# Patient Record
Sex: Female | Born: 1975 | Race: White | Hispanic: No | Marital: Married | Smoking: Former smoker
Health system: Southern US, Community
[De-identification: ages and names within clinical notes are randomized; demographics above are authoritative.]

## PROBLEM LIST (undated history)

## (undated) DIAGNOSIS — B360 Pityriasis versicolor: Secondary | ICD-10-CM

## (undated) DIAGNOSIS — J020 Streptococcal pharyngitis: Secondary | ICD-10-CM

## (undated) DIAGNOSIS — Z8619 Personal history of other infectious and parasitic diseases: Secondary | ICD-10-CM

## (undated) DIAGNOSIS — Z87898 Personal history of other specified conditions: Secondary | ICD-10-CM

## (undated) DIAGNOSIS — F17200 Nicotine dependence, unspecified, uncomplicated: Secondary | ICD-10-CM

## (undated) DIAGNOSIS — J302 Other seasonal allergic rhinitis: Secondary | ICD-10-CM

## (undated) HISTORY — PX: DILATION AND CURETTAGE OF UTERUS: SHX78

## (undated) HISTORY — DX: Personal history of other specified conditions: Z87.898

## (undated) HISTORY — DX: Personal history of other infectious and parasitic diseases: Z86.19

## (undated) HISTORY — DX: Pityriasis versicolor: B36.0

## (undated) HISTORY — PX: WISDOM TOOTH EXTRACTION: SHX21

---

## 2004-01-06 ENCOUNTER — Other Ambulatory Visit: Admission: RE | Admit: 2004-01-06 | Discharge: 2004-01-06 | Payer: Self-pay | Admitting: Obstetrics and Gynecology

## 2004-02-20 ENCOUNTER — Observation Stay: Payer: Self-pay

## 2004-10-01 ENCOUNTER — Inpatient Hospital Stay: Payer: Self-pay | Admitting: Unknown Physician Specialty

## 2005-12-28 ENCOUNTER — Ambulatory Visit: Payer: Self-pay | Admitting: Gynecology

## 2006-02-11 ENCOUNTER — Ambulatory Visit: Payer: Self-pay | Admitting: Gynecology

## 2006-10-28 ENCOUNTER — Ambulatory Visit: Payer: Self-pay | Admitting: Family Medicine

## 2006-12-27 ENCOUNTER — Ambulatory Visit: Payer: Self-pay | Admitting: Gynecology

## 2007-01-17 ENCOUNTER — Ambulatory Visit: Payer: Self-pay | Admitting: Family Medicine

## 2007-02-03 ENCOUNTER — Ambulatory Visit: Payer: Self-pay | Admitting: Obstetrics & Gynecology

## 2010-09-22 NOTE — Assessment & Plan Note (Signed)
NAMEBENNY, Yvette Adams              ACCOUNT NO.:  [id number]   MEDICAL RECORD NO.:  [id number]          PATIENT TYPE:  POB   LOCATION:  [facility]         FACILITY:  [facility]   PHYSICIAN:  Remonia Richter, NP   DATE OF BIRTH:  [birth year]   DATE OF SERVICE:                                  CLINIC NOTE   Patient comes to the office today for her yearly Pap and pelvic.  Smoking.  She has been complaining of an ongoing yeast infection for the  past 2 years.  She has broken up with her recent boyfriend.  Since then,  she has had a negative gonorrhea, chlamydia but has not been checked for  the other sexually transmitted diseases.  She would also like to quite  smoking.  She has been smoking for approximately 14 years, one package  per day.  She has tried multiple other methods for quitting and has not  been successful.  She is not complaining of any cough but she is  complaining of a sore throat and some allergies.  She is currently happy  with her form of birth control, which is the IUD.  She does need her  forms filled out for work that include her ability to lift and bend.   PHYSICAL EXAMINATION:  VITAL SIGNS:  106/61 blood pressure, pulse is 79,  weight is 121.  GENERAL:  Well-developed, well-nourished 35 year old Caucasian female in  no acute distress.  HEENT:  Normocephalic, atraumatic.  NECK:  Supple.  No thyroid enlargement or masses.  HEART:  Regular rate and rhythm.  LUNGS:  Clear bilaterally.  ABDOMEN:  Soft, nontender with positive bowel sounds.  GENITALIA:  Externally, no lesions or discharges.  Internally, there is  a thick yellow discharge.  Cervix is somewhat friable.  Bimanual exam,  no cervical motion tenderness.  No adnexal mass.  EXTREMITIES:  No edema.  NEURO:  Alert, oriented, non anxious.   ASSESSMENT/PLAN:  1. Yearly Pap smear and pelvic.  She will be called with the results      of her Pap smear.  2. Vaginal discharge.  We will check herpes simplex  virus 1 and 2,      human immunodeficiency virus, rapid plasma reagin at this time.  3. Smoking cessation.  Patient is requesting Chantix.  She will be      given a starter pack for one month and then on to the regular      maintenance dose.  She is strongly encouraged in this endeavor.  4. Employment.  Her forms were filled out and signed.  She will      followup in one year for her annual exam, unless she has difficulty      between now and then.      Remonia Richter, NP     LR/MEDQ  D:  01/17/2007  T:  01/18/2007  Job:  [id number]

## 2011-06-16 ENCOUNTER — Emergency Department (HOSPITAL_COMMUNITY)
Admission: EM | Admit: 2011-06-16 | Discharge: 2011-06-16 | Disposition: A | Discharge status: Home / Self Care | Payer: Self-pay | Source: Home / Self Care | Attending: Emergency Medicine | Admitting: Emergency Medicine

## 2011-06-16 ENCOUNTER — Encounter (HOSPITAL_COMMUNITY): Payer: Self-pay | Admitting: Emergency Medicine

## 2011-06-16 DIAGNOSIS — J029 Acute pharyngitis, unspecified: Secondary | ICD-10-CM

## 2011-06-16 HISTORY — DX: Streptococcal pharyngitis: J02.0

## 2011-06-16 LAB — POCT RAPID STREP A: Streptococcus, Group A Screen (Direct): NEGATIVE

## 2011-06-16 MED ORDER — AMOXICILLIN 500 MG PO CAPS: 500.0000 mg | ORAL_CAPSULE | Freq: Three times a day (TID) | ORAL | Status: AC

## 2011-06-16 MED ORDER — FLUCONAZOLE 200 MG PO TABS: 200.0000 mg | ORAL_TABLET | Freq: Once | ORAL | Status: AC

## 2011-06-16 NOTE — ED Notes (Signed)
HERE WITH SORE THROAT,REDNESS AND WHITE PATCHES THAT STARTED YESTERDAY.PT HAS  HX POS STREP SOMETIMES X4 YEARLY.NO FEVER,N,V OR DIFF SWALLOWING.PT AHS TRIED GARGLING

## 2011-06-16 NOTE — ED Provider Notes (Signed)
History     CSN: [id number]  Arrival date & time 06/16/11  1909   First MD Initiated Contact with Patient 06/16/11 2132      Chief Complaint  Patient presents with  . Sore Throat    (Consider location/radiation/quality/duration/timing/severity/associated sxs/prior treatment) HPI Comments: Patient presents urgent care complaining of sore throat and redness irritation and white patches on her throat this started yesterday he has had many streptococcal infections in the past continues to smoke it hurts mainly when she swallows have perceived some tactile fevers denies any cough or nasal congestion. No shortness of breath.  Patient is a 36 y.o. female presenting with pharyngitis. The history is provided by the patient.  Sore Throat The current episode started 2 days ago. Pertinent negatives include no abdominal pain, no headaches and no shortness of breath. The symptoms are relieved by nothing.    Past Medical History  Diagnosis Date  . Strep throat     History reviewed. No pertinent past surgical history.  No family history on file.  History  Substance Use Topics  . Smoking status: Current Everyday Smoker  . Smokeless tobacco: Not on file  . Alcohol Use: Yes    OB History    Grav Para Term Preterm Abortions TAB SAB Ect Mult Living                  Review of Systems  Constitutional: Negative for fever, diaphoresis and activity change.  HENT: Positive for congestion, sore throat, rhinorrhea and trouble swallowing. Negative for ear pain.   Eyes: Negative for visual disturbance.  Respiratory: Negative for shortness of breath.   Gastrointestinal: Negative for abdominal pain.  Neurological: Negative for headaches.    Allergies  Erythromycin  Home Medications   Current Outpatient Rx  Name Route Sig Dispense Refill  . AMOXICILLIN 500 MG PO CAPS Oral Take 1 capsule (500 mg total) by mouth 3 (three) times daily. 21 capsule 0  . FLUCONAZOLE 200 MG PO TABS Oral Take 1  tablet (200 mg total) by mouth once. 1 tablet 0    BP 111/71  Pulse 88  Temp(Src) 98.4 F (36.9 C) (Oral)  Resp 16  SpO2 100%  Physical Exam  Nursing note and vitals reviewed. Constitutional: She appears well-developed and well-nourished. No distress.  HENT:  Head: Normocephalic.  Mouth/Throat: Uvula is midline. Oropharyngeal exudate, posterior oropharyngeal edema and posterior oropharyngeal erythema present. No tonsillar abscesses.  Eyes: Right eye exhibits no discharge. Left eye exhibits no discharge.  Neck: Normal range of motion. Neck supple. No JVD present.  Lymphadenopathy:    She has cervical adenopathy.    ED Course  Procedures (including critical care time)   Labs Reviewed  POCT RAPID STREP A ([facility] ONLY)   No results found.   1. Pharyngitis       MDM  Exudative pharyngitis.  Jimmie Molly, MD 06/16/11 [phone]

## 2011-07-02 ENCOUNTER — Telehealth (HOSPITAL_COMMUNITY): Payer: Self-pay | Admitting: *Deleted

## 2011-07-02 NOTE — ED Notes (Signed)
2/21 Pt. called and said she took the Diflucan but when she finished antibiotic she still had sx.'s.  Requested a refill. 2/22 I could not find pt.'s chart. I called @ 1215 and left message to call. 1300 Pt. called back and chart accessed. Discussed with Dr. Tressia Danas and order obtained for Fluconazole 150 mg. # 3 1 Q 72 hrs as needed for symptoms of yeast infection. Pt. wants Rx. Called to [facility] in [place]. Rx. Called to pharmacist @ [phone]. Vassie Moselle 07/02/2011

## 2012-03-07 ENCOUNTER — Encounter: Payer: Self-pay | Admitting: Family Medicine

## 2012-03-07 ENCOUNTER — Ambulatory Visit (INDEPENDENT_AMBULATORY_CARE_PROVIDER_SITE_OTHER): Payer: Managed Care, Other (non HMO) | Admitting: Family Medicine

## 2012-03-07 VITALS — BP 113/76 | HR 83 | Ht 63.0 in | Wt 129.0 lb

## 2012-03-07 DIAGNOSIS — T8339XA Other mechanical complication of intrauterine contraceptive device, initial encounter: Secondary | ICD-10-CM

## 2012-03-07 DIAGNOSIS — Z3049 Encounter for surveillance of other contraceptives: Secondary | ICD-10-CM

## 2012-03-07 DIAGNOSIS — F17200 Nicotine dependence, unspecified, uncomplicated: Secondary | ICD-10-CM

## 2012-03-07 DIAGNOSIS — Z30432 Encounter for removal of intrauterine contraceptive device: Secondary | ICD-10-CM

## 2012-03-07 DIAGNOSIS — Z720 Tobacco use: Secondary | ICD-10-CM | POA: Insufficient documentation

## 2012-03-07 DIAGNOSIS — Z309 Encounter for contraceptive management, unspecified: Secondary | ICD-10-CM

## 2012-03-07 DIAGNOSIS — Z30431 Encounter for routine checking of intrauterine contraceptive device: Secondary | ICD-10-CM

## 2012-03-07 MED ORDER — VARENICLINE TARTRATE 0.5 MG PO TABS: 1.0000 mg | ORAL_TABLET | Freq: Two times a day (BID) | ORAL | Status: DC

## 2012-03-07 MED ORDER — VARENICLINE TARTRATE 1 MG PO TABS: 1.0000 mg | ORAL_TABLET | Freq: Two times a day (BID) | ORAL | Status: DC

## 2012-03-07 NOTE — Assessment & Plan Note (Signed)
Attempted removal in office, but failed--will attempt in OR when pt. More comfortable--will schedule.  Pt. Is unsure about further contraception and will consider options.  Considering BTL--will call back if desires.

## 2012-03-07 NOTE — Progress Notes (Signed)
Subjective:    Patient ID: Yvette Adams, female    DOB: 11-07-1975, 36 y.o.   MRN: [id number]  HPI Here for IUD removal.  Placed 8 yrs ago and still amenorrheic.  She desires to switch to OC's.  She is 36 and a smoker.  Options reviewed with pt.  She is unsure about options.  Does not want more kids.  Discussed permanent options.  Wants lighter cycles.  She is interested in smoking cessation.  Enquires about chantix.  Has tried and failed Wellbutrin in the past.  Past Medical History  Diagnosis Date  . Strep throat   . History of abnormal Pap smear   . History of herpes simplex type 2 infection    Past Surgical History  Procedure Date  . Wisdom tooth extraction     x4   History   Social History  . Marital Status: Single    Spouse Name: N/A    Number of Children: N/A  . Years of Education: N/A   Occupational History  . Not on file.   Social History Main Topics  . Smoking status: Current Every Day Smoker -- 1.0 packs/day  . Smokeless tobacco: Not on file   Comment: Patient would like prescription to help.  . Alcohol Use: Yes  . Drug Use: No  . Sexually Active: Yes -- Female partner(s)   Other Topics Concern  . Not on file   Social History Narrative  . No narrative on file   Family History  Problem Relation Age of Onset  . Asthma Mother   . Hypertension Mother   . Alcohol abuse Father   . Cancer Maternal Grandmother     pancreatic  . Diabetes Maternal Grandmother    Allergies  Allergen Reactions  . Erythromycin     ABDOMINAL CRAMPING   Current Outpatient Prescriptions on File Prior to Visit  Medication Sig Dispense Refill  . fluticasone (FLONASE) 50 MCG/ACT nasal spray        Review of Systems  Constitutional: Negative for fever and chills.  HENT: Positive for congestion, sore throat and rhinorrhea. Negative for nosebleeds.   Eyes: Negative for visual disturbance.  Respiratory: Negative for chest tightness and shortness of breath.   Cardiovascular:  Negative for chest pain and leg swelling.  Gastrointestinal: Negative for nausea, vomiting, abdominal pain, diarrhea, constipation, blood in stool and abdominal distention.  Genitourinary: Positive for vaginal discharge (white, with IUD). Negative for dysuria, frequency, vaginal bleeding and menstrual problem.  Musculoskeletal: Negative for arthralgias.  Skin: Negative for rash.  Neurological: Negative for dizziness and headaches.  Psychiatric/Behavioral: Negative for behavioral problems, disturbed wake/sleep cycle and dysphoric mood.  All other systems reviewed and are negative.      Objective:   Physical Exam  Vitals reviewed. Constitutional: She is oriented to person, place, and time. She appears well-developed and well-nourished.  HENT:  Head: Normocephalic and atraumatic.  Eyes: No scleral icterus.  Neck: Neck supple.  Cardiovascular: Normal rate and regular rhythm.   Pulmonary/Chest: Effort normal.  Abdominal: Soft. There is no tenderness.  Genitourinary: Vagina normal and uterus normal. No vaginal discharge found.  Musculoskeletal: Normal range of motion.  Neurological: She is alert and oriented to person, place, and time.  Skin: Skin is warm and dry.  Psychiatric: She has a normal mood and affect.   Procedure:  Speculum inserted into the vagina.  IUD strings could not be visualized.  Curved long Kelly clamp passed into cervical os x 4 without grasping part of  IUD or strings.  IUD hook inserted into uterine cavity x 3 and IUD could not be gotten, although it was felt.  Pt. Could no longer tolerate the discomfort of the procedure.    Assessment & Plan:

## 2012-03-07 NOTE — Patient Instructions (Signed)
Smoking Cessation Quitting smoking is important to your health and has many advantages. However, it is not always easy to quit since nicotine is a very addictive drug. Often times, people try 3 times or more before being able to quit. This document explains the best ways for you to prepare to quit smoking. Quitting takes hard work and a lot of effort, but you can do it. ADVANTAGES OF QUITTING SMOKING  You will live longer, feel better, and live better.  Your body will feel the impact of quitting smoking almost immediately.  Within 20 minutes, blood pressure decreases. Your pulse returns to its normal level.  After 8 hours, carbon monoxide levels in the blood return to normal. Your oxygen level increases.  After 24 hours, the chance of having a heart attack starts to decrease. Your breath, hair, and body stop smelling like smoke.  After 48 hours, damaged nerve endings begin to recover. Your sense of taste and smell improve.  After 72 hours, the body is virtually free of nicotine. Your bronchial tubes relax and breathing becomes easier.  After 2 to 12 weeks, lungs can hold more air. Exercise becomes easier and circulation improves.  The risk of having a heart attack, stroke, cancer, or lung disease is greatly reduced.  After 1 year, the risk of coronary heart disease is cut in half.  After 5 years, the risk of stroke falls to the same as a nonsmoker.  After 10 years, the risk of lung cancer is cut in half and the risk of other cancers decreases significantly.  After 15 years, the risk of coronary heart disease drops, usually to the level of a nonsmoker.  If you are pregnant, quitting smoking will improve your chances of having a healthy baby.  The people you live with, especially any children, will be healthier.  You will have extra money to spend on things other than cigarettes. QUESTIONS TO THINK ABOUT BEFORE ATTEMPTING TO QUIT You may want to talk about your answers with your  caregiver.  Why do you want to quit?  If you tried to quit in the past, what helped and what did not?  What will be the most difficult situations for you after you quit? How will you plan to handle them?  Who can help you through the tough times? Your family? Friends? A caregiver?  What pleasures do you get from smoking? What ways can you still get pleasure if you quit? Here are some questions to ask your caregiver:  How can you help me to be successful at quitting?  What medicine do you think would be best for me and how should I take it?  What should I do if I need more help?  What is smoking withdrawal like? How can I get information on withdrawal? GET READY  Set a quit date.  Change your environment by getting rid of all cigarettes, ashtrays, matches, and lighters in your home, car, or work. Do not let people smoke in your home.  Review your past attempts to quit. Think about what worked and what did not. GET SUPPORT AND ENCOURAGEMENT You have a better chance of being successful if you have help. You can get support in many ways.  Tell your family, friends, and co-workers that you are going to quit and need their support. Ask them not to smoke around you.  Get individual, group, or telephone counseling and support. Programs are available at local hospitals and health centers. Call your local health department for   information about programs in your area.  Spiritual beliefs and practices may help some smokers quit.  Download a "quit meter" on your computer to keep track of quit statistics, such as how long you have gone without smoking, cigarettes not smoked, and money saved.  Get a self-help book about quitting smoking and staying off of tobacco. LEARN NEW SKILLS AND BEHAVIORS  Distract yourself from urges to smoke. Talk to someone, go for a walk, or occupy your time with a task.  Change your normal routine. Take a different route to work. Drink tea instead of coffee.  Eat breakfast in a different place.  Reduce your stress. Take a hot bath, exercise, or read a book.  Plan something enjoyable to do every day. Reward yourself for not smoking.  Explore interactive web-based programs that specialize in helping you quit. GET MEDICINE AND USE IT CORRECTLY Medicines can help you stop smoking and decrease the urge to smoke. Combining medicine with the above behavioral methods and support can greatly increase your chances of successfully quitting smoking.  Nicotine replacement therapy helps deliver nicotine to your body without the negative effects and risks of smoking. Nicotine replacement therapy includes nicotine gum, lozenges, inhalers, nasal sprays, and skin patches. Some may be available over-the-counter and others require a prescription.  Antidepressant medicine helps people abstain from smoking, but how this works is unknown. This medicine is available by prescription.  Nicotinic receptor partial agonist medicine simulates the effect of nicotine in your brain. This medicine is available by prescription. Ask your caregiver for advice about which medicines to use and how to use them based on your health history. Your caregiver will tell you what side effects to look out for if you choose to be on a medicine or therapy. Carefully read the information on the package. Do not use any other product containing nicotine while using a nicotine replacement product.  RELAPSE OR DIFFICULT SITUATIONS Most relapses occur within the first 3 months after quitting. Do not be discouraged if you start smoking again. Remember, most people try several times before finally quitting. You may have symptoms of withdrawal because your body is used to nicotine. You may crave cigarettes, be irritable, feel very hungry, cough often, get headaches, or have difficulty concentrating. The withdrawal symptoms are only temporary. They are strongest when you first quit, but they will go away within  10 14 days. To reduce the chances of relapse, try to:  Avoid drinking alcohol. Drinking lowers your chances of successfully quitting.  Reduce the amount of caffeine you consume. Once you quit smoking, the amount of caffeine in your body increases and can give you symptoms, such as a rapid heartbeat, sweating, and anxiety.  Avoid smokers because they can make you want to smoke.  Do not let weight gain distract you. Many smokers will gain weight when they quit, usually less than 10 pounds. Eat a healthy diet and stay active. You can always lose the weight gained after you quit.  Find ways to improve your mood other than smoking. FOR MORE INFORMATION  www.smokefree.gov  Document Released: 04/20/2001 Document Revised: 10/26/2011 Document Reviewed: 08/05/2011 [facility] Patient Information 2013 [place], [place]. Contraception Choices Contraception (birth control) is the use of any methods or devices to prevent pregnancy. Below are some methods to help avoid pregnancy. HORMONAL METHODS   Contraceptive implant. This is a thin, plastic tube containing progesterone hormone. It does not contain estrogen hormone. Your caregiver inserts the tube in the inner part of the upper arm.  The tube can remain in place for up to 3 years. After 3 years, the implant must be removed. The implant prevents the ovaries from releasing an egg (ovulation), thickens the cervical mucus which prevents sperm from entering the uterus, and thins the lining of the inside of the uterus.  Progesterone-only injections. These injections are given every 3 months by your caregiver to prevent pregnancy. This synthetic progesterone hormone stops the ovaries from releasing eggs. It also thickens cervical mucus and changes the uterine lining. This makes it harder for sperm to survive in the uterus.  Birth control pills. These pills contain estrogen and progesterone hormone. They work by stopping the egg from forming in the ovary (ovulation).  Birth control pills are prescribed by a caregiver.Birth control pills can also be used to treat heavy periods.  Minipill. This type of birth control pill contains only the progesterone hormone. They are taken every day of each month and must be prescribed by your caregiver.  Birth control patch. The patch contains hormones similar to those in birth control pills. It must be changed once a week and is prescribed by a caregiver.  Vaginal ring. The ring contains hormones similar to those in birth control pills. It is left in the vagina for 3 weeks, removed for 1 week, and then a new one is put back in place. The patient must be comfortable inserting and removing the ring from the vagina.A caregiver's prescription is necessary.  Emergency contraception. Emergency contraceptives prevent pregnancy after unprotected sexual intercourse. This pill can be taken right after sex or up to 5 days after unprotected sex. It is most effective the sooner you take the pills after having sexual intercourse. Emergency contraceptive pills are available without a prescription. Check with your pharmacist. Do not use emergency contraception as your only form of birth control. BARRIER METHODS   Female condom. This is a thin sheath (latex or rubber) that is worn over the penis during sexual intercourse. It can be used with spermicide to increase effectiveness.  Female condom. This is a soft, loose-fitting sheath that is put into the vagina before sexual intercourse.  Diaphragm. This is a soft, latex, dome-shaped barrier that must be fitted by a caregiver. It is inserted into the vagina, along with a spermicidal jelly. It is inserted before intercourse. The diaphragm should be left in the vagina for 6 to 8 hours after intercourse.  Cervical cap. This is a round, soft, latex or plastic cup that fits over the cervix and must be fitted by a caregiver. The cap can be left in place for up to 48 hours after intercourse.  Sponge.  This is a soft, circular piece of polyurethane foam. The sponge has spermicide in it. It is inserted into the vagina after wetting it and before sexual intercourse.  Spermicides. These are chemicals that kill or block sperm from entering the cervix and uterus. They come in the form of creams, jellies, suppositories, foam, or tablets. They do not require a prescription. They are inserted into the vagina with an applicator before having sexual intercourse. The process must be repeated every time you have sexual intercourse. INTRAUTERINE CONTRACEPTION  Intrauterine device (IUD). This is a T-shaped device that is put in a woman's uterus during a menstrual period to prevent pregnancy. There are 2 types:  Copper IUD. This type of IUD is wrapped in copper wire and is placed inside the uterus. Copper makes the uterus and fallopian tubes produce a fluid that kills sperm. It can  stay in place for 10 years.  Hormone IUD. This type of IUD contains the hormone progestin (synthetic progesterone). The hormone thickens the cervical mucus and prevents sperm from entering the uterus, and it also thins the uterine lining to prevent implantation of a fertilized egg. The hormone can weaken or kill the sperm that get into the uterus. It can stay in place for 5 years. PERMANENT METHODS OF CONTRACEPTION  Female tubal ligation. This is when the woman's fallopian tubes are surgically sealed, tied, or blocked to prevent the egg from traveling to the uterus.  Female sterilization. This is when the female has the tubes that carry sperm tied off (vasectomy).This blocks sperm from entering the vagina during sexual intercourse. After the procedure, the man can still ejaculate fluid (semen). NATURAL PLANNING METHODS  Natural family planning. This is not having sexual intercourse or using a barrier method (condom, diaphragm, cervical cap) on days the woman could become pregnant.  Calendar method. This is keeping track of the length  of each menstrual cycle and identifying when you are fertile.  Ovulation method. This is avoiding sexual intercourse during ovulation.  Symptothermal method. This is avoiding sexual intercourse during ovulation, using a thermometer and ovulation symptoms.  Post-ovulation method. This is timing sexual intercourse after you have ovulated. Regardless of which type or method of contraception you choose, it is important that you use condoms to protect against the transmission of sexually transmitted diseases (STDs). Talk with your caregiver about which form of contraception is most appropriate for you. Document Released: 04/26/2005 Document Revised: 07/19/2011 Document Reviewed: 09/02/2010 [facility] Patient Information 2013 [place], [place].

## 2012-03-07 NOTE — Assessment & Plan Note (Signed)
Trial of chantix--take 1/2 tab bid for 1 wk, then increase to 1 tab bid.  Side effects discussed at length.

## 2012-03-08 ENCOUNTER — Encounter (HOSPITAL_COMMUNITY): Payer: Self-pay | Admitting: Pharmacist

## 2012-03-28 NOTE — OR Nursing (Signed)
Case moved to 0900 per Dr. Request via [place].

## 2012-04-07 ENCOUNTER — Encounter (HOSPITAL_COMMUNITY): Payer: Self-pay | Admitting: *Deleted

## 2012-04-13 ENCOUNTER — Encounter (HOSPITAL_COMMUNITY): Payer: Self-pay | Admitting: Certified Registered"

## 2012-04-13 ENCOUNTER — Ambulatory Visit (HOSPITAL_COMMUNITY)
Admission: RE | Admit: 2012-04-13 | Discharge: 2012-04-13 | Disposition: A | Discharge status: Home / Self Care | Payer: Managed Care, Other (non HMO) | Source: Ambulatory Visit | Attending: Family Medicine | Admitting: Family Medicine

## 2012-04-13 ENCOUNTER — Encounter (HOSPITAL_COMMUNITY)
Admission: RE | Disposition: A | Discharge status: Home / Self Care | Payer: Self-pay | Source: Ambulatory Visit | Attending: Family Medicine

## 2012-04-13 ENCOUNTER — Ambulatory Visit (HOSPITAL_COMMUNITY): Payer: Managed Care, Other (non HMO) | Admitting: Certified Registered"

## 2012-04-13 DIAGNOSIS — Z30430 Encounter for insertion of intrauterine contraceptive device: Secondary | ICD-10-CM

## 2012-04-13 DIAGNOSIS — Z30433 Encounter for removal and reinsertion of intrauterine contraceptive device: Secondary | ICD-10-CM | POA: Insufficient documentation

## 2012-04-13 DIAGNOSIS — T8339XA Other mechanical complication of intrauterine contraceptive device, initial encounter: Secondary | ICD-10-CM | POA: Diagnosis present

## 2012-04-13 HISTORY — PX: INTRAUTERINE DEVICE (IUD) INSERTION: SHX5877

## 2012-04-13 HISTORY — DX: Other seasonal allergic rhinitis: J30.2

## 2012-04-13 HISTORY — DX: Nicotine dependence, unspecified, uncomplicated: F17.200

## 2012-04-13 HISTORY — PX: IUD REMOVAL: SHX5392

## 2012-04-13 LAB — CBC
MCH: 30 pg (ref 26.0–34.0)
Platelets: 202 10*3/uL (ref 150–400)
RBC: 4.36 MIL/uL (ref 3.87–5.11)
WBC: 5.9 10*3/uL (ref 4.0–10.5)

## 2012-04-13 LAB — PREGNANCY, URINE: Preg Test, Ur: NEGATIVE

## 2012-04-13 SURGERY — REMOVAL, INTRAUTERINE DEVICE
Anesthesia: Monitor Anesthesia Care | Site: Vagina | Wound class: Clean Contaminated

## 2012-04-13 MED ORDER — LIDOCAINE HCL (CARDIAC) 20 MG/ML IV SOLN
INTRAVENOUS | Status: DC | PRN
  Administered 2012-04-13: 50 mg via INTRAVENOUS

## 2012-04-13 MED ORDER — ONDANSETRON HCL 4 MG/2ML IJ SOLN
INTRAMUSCULAR | Status: DC | PRN
  Administered 2012-04-13: 4 mg via INTRAVENOUS

## 2012-04-13 MED ORDER — FENTANYL CITRATE 0.05 MG/ML IJ SOLN
INTRAMUSCULAR | Status: AC
  Filled 2012-04-13: qty 2

## 2012-04-13 MED ORDER — CEFAZOLIN SODIUM-DEXTROSE 2-3 GM-% IV SOLR
2.0000 g | INTRAVENOUS | Status: AC
  Administered 2012-04-13: 2 g via INTRAVENOUS

## 2012-04-13 MED ORDER — PROPOFOL 10 MG/ML IV EMUL
INTRAVENOUS | Status: DC | PRN
  Administered 2012-04-13 (×4): 20 mg via INTRAVENOUS
  Administered 2012-04-13: 40 mg via INTRAVENOUS
  Administered 2012-04-13: 20 mg via INTRAVENOUS
  Administered 2012-04-13: 10 mg via INTRAVENOUS
  Administered 2012-04-13: 40 mg via INTRAVENOUS
  Administered 2012-04-13: 20 mg via INTRAVENOUS

## 2012-04-13 MED ORDER — FENTANYL CITRATE 0.05 MG/ML IJ SOLN: 25.0000 ug | INTRAMUSCULAR | Status: DC | PRN

## 2012-04-13 MED ORDER — ONDANSETRON HCL 4 MG/2ML IJ SOLN
INTRAMUSCULAR | Status: AC
  Filled 2012-04-13: qty 2

## 2012-04-13 MED ORDER — MIDAZOLAM HCL 5 MG/5ML IJ SOLN
INTRAMUSCULAR | Status: DC | PRN
  Administered 2012-04-13: 2 mg via INTRAVENOUS

## 2012-04-13 MED ORDER — PROPOFOL 10 MG/ML IV EMUL
INTRAVENOUS | Status: AC
  Filled 2012-04-13: qty 20

## 2012-04-13 MED ORDER — LIDOCAINE HCL 1 % IJ SOLN
INTRAMUSCULAR | Status: DC | PRN
  Administered 2012-04-13: 20 mL

## 2012-04-13 MED ORDER — DEXAMETHASONE SODIUM PHOSPHATE 10 MG/ML IJ SOLN
INTRAMUSCULAR | Status: AC
  Filled 2012-04-13: qty 1

## 2012-04-13 MED ORDER — KETOROLAC TROMETHAMINE 30 MG/ML IJ SOLN: 15.0000 mg | Freq: Once | INTRAMUSCULAR | Status: DC | PRN

## 2012-04-13 MED ORDER — IBUPROFEN 600 MG PO TABS: 600.0000 mg | ORAL_TABLET | Freq: Four times a day (QID) | ORAL | Status: DC | PRN

## 2012-04-13 MED ORDER — GLYCOPYRROLATE 0.2 MG/ML IJ SOLN
INTRAMUSCULAR | Status: AC
  Filled 2012-04-13: qty 1

## 2012-04-13 MED ORDER — MIDAZOLAM HCL 2 MG/2ML IJ SOLN
INTRAMUSCULAR | Status: AC
  Filled 2012-04-13: qty 2

## 2012-04-13 MED ORDER — LIDOCAINE HCL (CARDIAC) 20 MG/ML IV SOLN
INTRAVENOUS | Status: AC
  Filled 2012-04-13: qty 5

## 2012-04-13 MED ORDER — LACTATED RINGERS IV SOLN
INTRAVENOUS | Status: DC
  Administered 2012-04-13: 09:00:00 via INTRAVENOUS

## 2012-04-13 MED ORDER — CEFAZOLIN SODIUM-DEXTROSE 2-3 GM-% IV SOLR
INTRAVENOUS | Status: AC
  Filled 2012-04-13: qty 50

## 2012-04-13 MED ORDER — FENTANYL CITRATE 0.05 MG/ML IJ SOLN
INTRAMUSCULAR | Status: DC | PRN
  Administered 2012-04-13: 50 ug via INTRAVENOUS
  Administered 2012-04-13 (×2): 25 ug via INTRAVENOUS

## 2012-04-13 MED ORDER — LACTATED RINGERS IV SOLN
Freq: Once | INTRAVENOUS | Status: AC
  Administered 2012-04-13: 09:00:00 via INTRAVENOUS

## 2012-04-13 SURGICAL SUPPLY — 14 items
CATH ROBINSON RED A/P 16FR (CATHETERS) ×2 IMPLANT
CLOTH BEACON ORANGE TIMEOUT ST (SAFETY) ×2 IMPLANT
COVER TABLE BACK 60X90 (DRAPES) ×1 IMPLANT
DRAPE UNDERBUTTOCKS STRL (DRAPE) ×1 IMPLANT
GLOVE BIOGEL PI IND STRL 7.0 (GLOVE) ×2 IMPLANT
GLOVE BIOGEL PI INDICATOR 7.0 (GLOVE) ×2
GLOVE ECLIPSE 7.0 STRL STRAW (GLOVE) ×2 IMPLANT
GOWN STRL REIN XL XLG (GOWN DISPOSABLE) ×4 IMPLANT
NDL SPNL 22GX3.5 QUINCKE BK (NEEDLE) ×1 IMPLANT
NEEDLE SPNL 22GX3.5 QUINCKE BK (NEEDLE) ×2 IMPLANT
PAD PREP 24X48 CUFFED NSTRL (MISCELLANEOUS) ×2 IMPLANT
SYR CONTROL 10ML LL (SYRINGE) ×2 IMPLANT
TOWEL OR 17X24 6PK STRL BLUE (TOWEL DISPOSABLE) ×4 IMPLANT
WATER STERILE IRR 1000ML POUR (IV SOLUTION) ×2 IMPLANT

## 2012-04-13 NOTE — H&P (Signed)
Yvette Adams is an 36 y.o. G33P1011 Unknown female.   Chief Complaint: Retained IUD  HPI: 36 y.o. G2P1011 who has a retained IUD and it has been in for 8 years.  She would like it removed and would like another IUD inserted.   Past Medical History  Diagnosis Date  . Strep throat   . History of abnormal Pap smear   . History of herpes simplex type 2 infection   . Seasonal allergies   . Smoker   . SVD (spontaneous vaginal delivery)     x 1    Past Surgical History  Procedure Date  . Wisdom tooth extraction     x4  . Dilation and curettage of uterus     Family History  Problem Relation Age of Onset  . Asthma Mother   . Hypertension Mother   . Alcohol abuse Father   . Cancer Maternal Grandmother     pancreatic  . Diabetes Maternal Grandmother    Social History:  reports that she has been smoking Cigarettes.  She has a 20 pack-year smoking history. She has never used smokeless tobacco. She reports that she drinks alcohol. She reports that she does not use illicit drugs.  Allergies:  Allergies  Allergen Reactions  . Erythromycin     ABDOMINAL CRAMPING OK with ZPAK    Medications Prior to Admission  Medication Sig Dispense Refill  . azithromycin (ZITHROMAX) 250 MG tablet       . benzonatate (TESSALON) 100 MG capsule       . cetirizine (ZYRTEC) 10 MG tablet Take 10 mg by mouth daily as needed.       . fluticasone (FLONASE) 50 MCG/ACT nasal spray       . ibuprofen (ADVIL,MOTRIN) 600 MG tablet 600 mg every 8 (eight) hours as needed.       . predniSONE (STERAPRED UNI-PAK) 10 MG tablet Take 10 mg by mouth daily.      . pseudoephedrine-guaifenesin (MUCINEX D) 60-600 MG per tablet Take 1 tablet by mouth every 12 (twelve) hours.      . varenicline (CHANTIX CONTINUING MONTH PAK) 1 MG tablet Take 1 tablet (1 mg total) by mouth 2 (two) times daily.  60 tablet  3     A comprehensive review of systems was negative.  Blood pressure 113/63, pulse 73, temperature 98.1 F (36.7  C), temperature source Oral, resp. rate 16, height 5\' 2"  (1.575 m), weight 130 lb (58.968 kg), SpO2 99.00%. BP 113/63  Pulse 73  Temp 98.1 F (36.7 C) (Oral)  Resp 16  Ht 5\' 2"  (1.575 m)  Wt 130 lb (58.968 kg)  BMI 23.78 kg/m2  SpO2 99% General appearance: alert, cooperative and appears stated age Head: Normocephalic, without obvious abnormality, atraumatic Eyes: negative findings: conjunctivae and sclerae normal Neck: supple, symmetrical, trachea midline Lungs: clear to auscultation bilaterally Heart: regular rate and rhythm Abdomen: soft, non-tender; bowel sounds normal; no masses,  no organomegaly Extremities: extremities normal, atraumatic, no cyanosis or edema Skin: Skin color, texture, turgor normal. No rashes or lesions Neurologic: Grossly normal   No results found for this basename: WBC, HGB, HCT, MCV, PLT   Lab Results  Component Value Date   PREGTESTUR Negative 03/07/2012     Assessment/Plan Patient Active Problem List  Diagnosis  . Mechanical complication of IUD-retained  . Tobacco abuse  . Encounter for IUD insertion   Dilation and Curettage with IUD removal and re-insertion of IUD.  Yvette Adams S 04/13/2012, 8:36 AM

## 2012-04-13 NOTE — Op Note (Signed)
PROCEDURE DATE: 04/13/2012  PREOPERATIVE DIAGNOSIS: Retained IUD, desires placement of another IUD  POSTOPERATIVE DIAGNOSIS: The same  PROCEDURE:     Dilation and  Removal of IUD, Re-insertion of Mirena IUD  SURGEON:  Kris No S  INDICATIONS: Pt. Has retained IOUD, unable to remove in the office, who desires removal and replacement with new IUD. Risks of surgery were discussed with the patient including but not limited to: bleeding which may require transfusion; infection which may require antibiotics; injury to uterus or surrounding organs.  Likelihood of obtaining sample is high.  FINDINGS:  A 7 wk size uterus, nml appearing cervix.  ANESTHESIA:    Monitored intravenous sedation, paracervical block-1% Lidocaine with Epinephrine.  ESTIMATED BLOOD LOSS:  Less than 20 ml.  SPECIMENS:  None  COMPLICATIONS:  None immediate.  PROCEDURE DETAILS:  The patient received intravenous antibiotics while in the preoperative area.  She was then taken to the operating room where general anesthesia was administered and was found to be adequate.  After an adequate timeout was performed, she was placed in the dorsal lithotomy position and examined; then prepped and draped in the sterile manner.   Her bladder was catheterized for an unmeasured amount of clear, yellow urine. A vaginal speculum was then placed in the patient's vagina and a single tooth tenaculum was applied to the anterior lip of the cervix.  A paracervical block using 1% Lidocaine with Epi was administered. The uterus sounded to 7 cm. The cervix was gently dilated to accommodate a small curette that was gently advanced to the uterine fundus.  The IUD was felt and dislodged.  It was removed with a long Kelly. Mirena IUD placed per manufacturer's recommendations.  Strings trimmed to 3 cm. There was minimal bleeding noted and the tenaculum removed with good hemostasis noted.  The patient tolerated the procedure well.  The patient was taken to the  recovery area in stable condition.  Avrielle Fry SMD 04/13/2012 9:32 AM

## 2012-04-13 NOTE — Anesthesia Preprocedure Evaluation (Signed)
Anesthesia Evaluation  Patient identified by MRN, date of birth, ID band Patient awake    Reviewed: Allergy & Precautions, H&P , NPO status , Patient's Chart, lab work & pertinent test results, reviewed documented beta blocker date and time   History of Anesthesia Complications Negative for: history of anesthetic complications  Airway Mallampati: I TM Distance: >3 FB Neck ROM: full    Dental  (+) Teeth Intact   Pulmonary Recent URI  (03/07/12 - strep throat, sinusitis - completed z-pack, prednisone), Current Smoker,  breath sounds clear to auscultation  Pulmonary exam normal       Cardiovascular Exercise Tolerance: Good negative cardio ROS  Rhythm:regular Rate:Normal     Neuro/Psych negative neurological ROS  negative psych ROS   GI/Hepatic negative GI ROS, Neg liver ROS,   Endo/Other  negative endocrine ROS  Renal/GU negative Renal ROS  negative genitourinary   Musculoskeletal   Abdominal   Peds  Hematology negative hematology ROS (+)   Anesthesia Other Findings   Reproductive/Obstetrics negative OB ROS                           Anesthesia Physical Anesthesia Plan  ASA: II  Anesthesia Plan: MAC   Post-op Pain Management:    Induction:   Airway Management Planned:   Additional Equipment:   Intra-op Plan:   Post-operative Plan:   Informed Consent: I have reviewed the patients History and Physical, chart, labs and discussed the procedure including the risks, benefits and alternatives for the proposed anesthesia with the patient or authorized representative who has indicated his/her understanding and acceptance.   Dental Advisory Given  Plan Discussed with: CRNA and Surgeon  Anesthesia Plan Comments:         Anesthesia Quick Evaluation

## 2012-04-13 NOTE — Anesthesia Postprocedure Evaluation (Signed)
Anesthesia Post Note  Patient: Yvette Adams  Procedure(s) Performed: Procedure(s) (LRB): INTRAUTERINE DEVICE (IUD) REMOVAL (N/A) INTRAUTERINE DEVICE (IUD) INSERTION (N/A)  Anesthesia type: MAC  Patient location: PACU  Post pain: Pain level controlled  Post assessment: Post-op Vital signs reviewed  Last Vitals:  Filed Vitals:   04/13/12 0945  BP: 114/80  Pulse: 79  Temp:   Resp: 19    Post vital signs: Reviewed  Level of consciousness: sedated  Complications: No apparent anesthesia complications

## 2012-04-13 NOTE — Transfer of Care (Signed)
Immediate Anesthesia Transfer of Care Note  Patient: Yvette Adams  Procedure(s) Performed: Procedure(s) (LRB) with comments: INTRAUTERINE DEVICE (IUD) REMOVAL (N/A) INTRAUTERINE DEVICE (IUD) INSERTION (N/A)  Patient Location: PACU  Anesthesia Type:MAC  Level of Consciousness: awake, alert  and oriented  Airway & Oxygen Therapy: Patient Spontanous Breathing and Patient connected to nasal cannula oxygen  Post-op Assessment: Report given to PACU RN and Post -op Vital signs reviewed and stable  Post vital signs: Reviewed and stable  Complications: No apparent anesthesia complications

## 2012-04-14 ENCOUNTER — Encounter (HOSPITAL_COMMUNITY): Payer: Self-pay | Admitting: Family Medicine

## 2012-05-12 ENCOUNTER — Ambulatory Visit (INDEPENDENT_AMBULATORY_CARE_PROVIDER_SITE_OTHER): Payer: Managed Care, Other (non HMO) | Admitting: Obstetrics & Gynecology

## 2012-05-12 ENCOUNTER — Encounter: Payer: Self-pay | Admitting: Obstetrics & Gynecology

## 2012-05-12 VITALS — BP 118/73 | HR 102 | Ht 63.0 in | Wt 131.0 lb

## 2012-05-12 DIAGNOSIS — Z1151 Encounter for screening for human papillomavirus (HPV): Secondary | ICD-10-CM

## 2012-05-12 DIAGNOSIS — Z01419 Encounter for gynecological examination (general) (routine) without abnormal findings: Secondary | ICD-10-CM

## 2012-05-12 DIAGNOSIS — Z0000 Encounter for general adult medical examination without abnormal findings: Secondary | ICD-10-CM

## 2012-05-12 DIAGNOSIS — Z124 Encounter for screening for malignant neoplasm of cervix: Secondary | ICD-10-CM

## 2012-05-12 DIAGNOSIS — Z113 Encounter for screening for infections with a predominantly sexual mode of transmission: Secondary | ICD-10-CM

## 2012-05-12 NOTE — Progress Notes (Signed)
Subjective:    Yvette Adams is a 37 y.o. female who presents for an annual exam. She would like her Mirena strings cuts shorter as they are poking her boyfriend's penis with sex. She understands that cutting them short may make the IUD removal difficult. The patient is sexually active. GYN screening history: last pap: was normal. The patient wears seatbelts: yes. The patient participates in regular exercise: yes. Has the patient ever been transfused or tattooed?: no. The patient reports that there is not domestic violence in her life.   Menstrual History: OB History    Grav Para Term Preterm Abortions TAB SAB Ect Mult Living   2 1 1  1  1   1       Menarche age: 44 No LMP recorded. Patient is not currently having periods (Reason: IUD).    The following portions of the patient's history were reviewed and updated as appropriate: allergies, current medications, past family history, past medical history, past social history, past surgical history and problem list.  Review of Systems A comprehensive review of systems was negative.    Objective:    BP 118/73  Pulse 102  Ht 5\' 3"  (1.6 m)  Wt 131 lb (59.421 kg)  BMI 23.21 kg/m2  General Appearance:    Alert, cooperative, no distress, appears stated age  Head:    Normocephalic, without obvious abnormality, atraumatic  Eyes:    PERRL, conjunctiva/corneas clear, EOM's intact, fundi    benign, both eyes  Ears:    Normal TM's and external ear canals, both ears  Nose:   Nares normal, septum midline, mucosa normal, no drainage    or sinus tenderness  Throat:   Lips, mucosa, and tongue normal; teeth and gums normal  Neck:   Supple, symmetrical, trachea midline, no adenopathy;    thyroid:  no enlargement/tenderness/nodules; no carotid   bruit or JVD  Back:     Symmetric, no curvature, ROM normal, no CVA tenderness  Lungs:     Clear to auscultation bilaterally, respirations unlabored  Chest Wall:    No tenderness or deformity   Heart:     Regular rate and rhythm, S1 and S2 normal, no murmur, rub   or gallop  Breast Exam:    No tenderness, masses, or nipple abnormality  Abdomen:     Soft, non-tender, bowel sounds active all four quadrants,    no masses, no organomegaly  Genitalia:    Normal female without lesion, discharge or tenderness, the strings were cut flush with the external cervical os, NSSA, NT, mobile, normal adnexal exam     Extremities:   Extremities normal, atraumatic, no cyanosis or edema  Pulses:   2+ and symmetric all extremities  Skin:   Skin color, texture, turgor normal, no rashes or lesions  Lymph nodes:   Cervical, supraclavicular, and axillary nodes normal  Neurologic:   CNII-XII intact, normal strength, sensation and reflexes    throughout  .    Assessment:    Healthy female exam.    Plan:     Thin prep Pap smear.

## 2012-05-23 ENCOUNTER — Encounter: Payer: Self-pay | Admitting: Obstetrics & Gynecology

## 2012-05-23 ENCOUNTER — Ambulatory Visit (INDEPENDENT_AMBULATORY_CARE_PROVIDER_SITE_OTHER): Payer: Managed Care, Other (non HMO) | Admitting: Obstetrics & Gynecology

## 2012-05-23 VITALS — BP 93/66 | HR 98 | Ht 63.0 in | Wt 131.0 lb

## 2012-05-23 DIAGNOSIS — L918 Other hypertrophic disorders of the skin: Secondary | ICD-10-CM

## 2012-05-23 DIAGNOSIS — L909 Atrophic disorder of skin, unspecified: Secondary | ICD-10-CM

## 2012-05-23 NOTE — Progress Notes (Signed)
  Subjective:    Patient ID: Yvette Adams, female    DOB: [birth year], 37 y.o.   MRN: [id number]  HPI She is here for a skin tag removal.  Review of Systems Pap smear normal this month    Objective:   Physical Exam  Skin tag area cleaned, prepped, infiltrated with 1 cc of 1% lidocaine. Skin tag removed and discarded. Hemostasis obtained with pressure and silver nitrate. She tolerated the procedure well.      Assessment & Plan:   Skin tag removal as above

## 2012-11-29 ENCOUNTER — Telehealth: Payer: Self-pay | Admitting: *Deleted

## 2012-11-29 DIAGNOSIS — N390 Urinary tract infection, site not specified: Secondary | ICD-10-CM

## 2012-11-29 MED ORDER — PHENAZOPYRIDINE HCL 200 MG PO TABS: 200.0000 mg | ORAL_TABLET | Freq: Three times a day (TID) | ORAL | Status: DC | PRN

## 2012-11-29 MED ORDER — CIPROFLOXACIN HCL 500 MG PO TABS: 500.0000 mg | ORAL_TABLET | Freq: Two times a day (BID) | ORAL | Status: DC

## 2012-11-29 NOTE — Telephone Encounter (Signed)
Patient is having burning and stinging with urination, she is getting ready to go out of town.  She will call for an appointment if it continues after taking the medication.

## 2012-12-15 ENCOUNTER — Ambulatory Visit: Payer: Self-pay | Admitting: Unknown Physician Specialty

## 2013-09-10 ENCOUNTER — Telehealth: Payer: Self-pay | Admitting: *Deleted

## 2013-09-10 DIAGNOSIS — B379 Candidiasis, unspecified: Secondary | ICD-10-CM

## 2013-09-10 MED ORDER — FLUCONAZOLE 150 MG PO TABS: 150.0000 mg | ORAL_TABLET | Freq: Once | ORAL | Status: DC

## 2013-09-10 NOTE — Telephone Encounter (Signed)
Patient is having a yeast infection and is requesting a Diflucan be called in.

## 2013-10-31 ENCOUNTER — Telehealth: Payer: Self-pay | Admitting: *Deleted

## 2013-10-31 DIAGNOSIS — B379 Candidiasis, unspecified: Secondary | ICD-10-CM

## 2013-10-31 MED ORDER — FLUCONAZOLE 150 MG PO TABS: 150.0000 mg | ORAL_TABLET | Freq: Once | ORAL | Status: DC

## 2013-10-31 NOTE — Telephone Encounter (Signed)
Patient has another yeast infection due to being in her bathing suit so much this summer.

## 2014-01-24 ENCOUNTER — Emergency Department: Payer: Self-pay | Admitting: Emergency Medicine

## 2014-01-24 LAB — BASIC METABOLIC PANEL
ANION GAP: 9 (ref 7–16)
BUN: 18 mg/dL (ref 7–18)
CALCIUM: 8.8 mg/dL (ref 8.5–10.1)
CHLORIDE: 109 mmol/L — AB (ref 98–107)
Co2: 22 mmol/L (ref 21–32)
Creatinine: 0.79 mg/dL (ref 0.60–1.30)
EGFR (African American): >60
Glucose: 86 mg/dL (ref 65–99)
Osmolality: 281 (ref 275–301)
POTASSIUM: 3.6 mmol/L (ref 3.5–5.1)
Sodium: 140 mmol/L (ref 136–145)

## 2014-01-24 LAB — CBC
HCT: 42.1 % (ref 35.0–47.0)
HGB: 14.3 g/dL (ref 12.0–16.0)
MCH: 31 pg (ref 26.0–34.0)
MCHC: 33.9 g/dL (ref 32.0–36.0)
MCV: 91 fL (ref 80–100)
PLATELETS: 210 10*3/uL (ref 150–440)
RBC: 4.61 10*6/uL (ref 3.80–5.20)
RDW: 12.5 % (ref 11.5–14.5)
WBC: 8.1 10*3/uL (ref 3.6–11.0)

## 2014-01-24 LAB — TROPONIN I: Troponin-I: <0.02 ng/mL

## 2014-01-24 LAB — D-DIMER(ARMC): D-DIMER: 376 ng/mL

## 2014-03-11 ENCOUNTER — Encounter: Payer: Self-pay | Admitting: Obstetrics & Gynecology

## 2015-12-12 DIAGNOSIS — J301 Allergic rhinitis due to pollen: Secondary | ICD-10-CM | POA: Insufficient documentation

## 2015-12-12 DIAGNOSIS — Z975 Presence of (intrauterine) contraceptive device: Secondary | ICD-10-CM | POA: Insufficient documentation

## 2016-11-22 ENCOUNTER — Telehealth: Payer: Self-pay | Admitting: *Deleted

## 2016-11-22 DIAGNOSIS — B3731 Acute candidiasis of vulva and vagina: Secondary | ICD-10-CM

## 2016-11-22 DIAGNOSIS — B373 Candidiasis of vulva and vagina: Secondary | ICD-10-CM

## 2016-11-22 MED ORDER — FLUCONAZOLE 150 MG PO TABS: 150.0000 mg | ORAL_TABLET | Freq: Once | ORAL | 0 refills | Status: AC

## 2016-11-22 NOTE — Telephone Encounter (Signed)
Pt has appt tomorrow for IUD renewal, states she is experiencing symptoms associated with a yeast infection.  Diflucan sent to pharmacy and instructed pt on medication use.

## 2016-11-23 ENCOUNTER — Ambulatory Visit (INDEPENDENT_AMBULATORY_CARE_PROVIDER_SITE_OTHER): Payer: Managed Care, Other (non HMO) | Admitting: Obstetrics & Gynecology

## 2016-11-23 ENCOUNTER — Encounter: Payer: Self-pay | Admitting: Obstetrics & Gynecology

## 2016-11-23 VITALS — BP 109/74 | HR 82 | Resp 18 | Ht 62.0 in | Wt 146.0 lb

## 2016-11-23 DIAGNOSIS — Z30433 Encounter for removal and reinsertion of intrauterine contraceptive device: Secondary | ICD-10-CM | POA: Diagnosis not present

## 2016-11-23 DIAGNOSIS — Z1231 Encounter for screening mammogram for malignant neoplasm of breast: Secondary | ICD-10-CM

## 2016-11-23 MED ORDER — LEVONORGESTREL 20 MCG/24HR IU IUD: INTRAUTERINE_SYSTEM | Freq: Once | INTRAUTERINE | Status: DC

## 2016-11-23 NOTE — Progress Notes (Signed)
    GYNECOLOGY OFFICE PROCEDURE NOTE  Yvette Adams is a 41 y.o. G2P1011 here for Mirena IUD removal and reinsertion. No GYN concerns.  Last pap smear was on 05/12/2012 and was normal.  IUD Removal and Reinsertion  Patient identified, informed consent performed, consent signed.   Discussed risks of irregular bleeding, cramping, infection, malpositioning or misplacement of the IUD outside the uterus which may require further procedures. Time out was performed. Speculum placed in the vagina. The strings of the IUD were grasped and pulled using ring forceps. The IUD was successfully removed in its entirety. The cervix was cleaned with Betadine x 2 and grasped anteriorly with a single tooth tenaculum. The new Mirena IUD insertion apparatus was used to sound the uterus to 7 cm;  the IUD was then placed per manufacturer's recommendations. Strings trimmed to 3 cm. Tenaculum was removed, good hemostasis noted. Patient tolerated procedure well.   Patient was given post-procedure instructions.   Patient was also asked to check IUD strings periodically and follow up in 4 weeks for IUD check and annual exam.   Yvette Adams  Briant Angelillo, MD, FACOG Attending Obstetrician & Gynecologist, [facility], [facility]

## 2016-11-23 NOTE — Patient Instructions (Signed)

## 2016-12-01 ENCOUNTER — Ambulatory Visit
Admission: RE | Admit: 2016-12-01 | Discharge: 2016-12-01 | Disposition: A | Discharge status: Home / Self Care | Payer: Managed Care, Other (non HMO) | Source: Ambulatory Visit | Attending: Obstetrics & Gynecology | Admitting: Obstetrics & Gynecology

## 2016-12-01 DIAGNOSIS — Z1231 Encounter for screening mammogram for malignant neoplasm of breast: Secondary | ICD-10-CM

## 2016-12-15 ENCOUNTER — Encounter: Payer: Self-pay | Admitting: Family Medicine

## 2016-12-15 ENCOUNTER — Ambulatory Visit (INDEPENDENT_AMBULATORY_CARE_PROVIDER_SITE_OTHER): Payer: Managed Care, Other (non HMO) | Admitting: Family Medicine

## 2016-12-15 VITALS — BP 107/70 | HR 89 | Ht 62.0 in | Wt 142.0 lb

## 2016-12-15 DIAGNOSIS — R319 Hematuria, unspecified: Secondary | ICD-10-CM | POA: Diagnosis not present

## 2016-12-15 DIAGNOSIS — Z124 Encounter for screening for malignant neoplasm of cervix: Secondary | ICD-10-CM | POA: Diagnosis not present

## 2016-12-15 DIAGNOSIS — Z1151 Encounter for screening for human papillomavirus (HPV): Secondary | ICD-10-CM | POA: Diagnosis not present

## 2016-12-15 DIAGNOSIS — Z01419 Encounter for gynecological examination (general) (routine) without abnormal findings: Secondary | ICD-10-CM | POA: Diagnosis not present

## 2016-12-15 DIAGNOSIS — N390 Urinary tract infection, site not specified: Secondary | ICD-10-CM | POA: Diagnosis not present

## 2016-12-15 DIAGNOSIS — Z87891 Personal history of nicotine dependence: Secondary | ICD-10-CM

## 2016-12-15 DIAGNOSIS — R8290 Unspecified abnormal findings in urine: Secondary | ICD-10-CM | POA: Diagnosis not present

## 2016-12-15 DIAGNOSIS — Z975 Presence of (intrauterine) contraceptive device: Secondary | ICD-10-CM

## 2016-12-15 LAB — POCT URINALYSIS DIPSTICK
BILIRUBIN UA: NEGATIVE
GLUCOSE UA: NEGATIVE
KETONES UA: POSITIVE
NITRITE UA: POSITIVE
Protein, UA: POSITIVE
Spec Grav, UA: 1.015 (ref 1.010–1.025)
Urobilinogen, UA: 0.2 E.U./dL
pH, UA: 6.5 (ref 5.0–8.0)

## 2016-12-15 MED ORDER — SULFAMETHOXAZOLE-TRIMETHOPRIM 800-160 MG PO TABS: 1.0000 | ORAL_TABLET | Freq: Two times a day (BID) | ORAL | 0 refills | Status: AC

## 2016-12-15 NOTE — Progress Notes (Signed)
CLINIC ENCOUNTER NOTE  History:  41 y.o. [id number] here today for well woman exam. She denies any abnormal vaginal discharge, bleeding, pelvic pain or other concerns. Does reports malodorous urine and dysuria. Had 2 other UTI this year, treated with macrobid and did not complete last regimen.   IUD placed several weeks ago , this is her 3rd device Genetic screening form filled out- positive screening, is of Ashkenazi jewish descent with several maternal side family members with colon cancer and one with pancreatic.   Past Medical History:  Diagnosis Date  . History of abnormal Pap smear   . History of herpes simplex type 2 infection   . Seasonal allergies   . Smoker   . Strep throat   . SVD (spontaneous vaginal delivery)    x 1    Past Surgical History:  Procedure Laterality Date  . DILATION AND CURETTAGE OF UTERUS    . INTRAUTERINE DEVICE (IUD) INSERTION  04/13/2012   Procedure: INTRAUTERINE DEVICE (IUD) INSERTION;  Surgeon: Reva Bores, MD;  Location: [facility];  Service: Gynecology;  Laterality: N/A;  . IUD REMOVAL  04/13/2012   Procedure: INTRAUTERINE DEVICE (IUD) REMOVAL;  Surgeon: Reva Bores, MD;  Location: [facility];  Service: Gynecology;  Laterality: N/A;  . WISDOM TOOTH EXTRACTION     x4    The following portions of the patient's history were reviewed and updated as appropriate: allergies, current medications, past family history, past medical history, past social history, past surgical history and problem list.   Health Maintenance:  Normal pap and negative HRHPV on 05/12/2012, no prior hx of pos. Mammogram 12/01/2016 [facility]   Review of Systems:  Pertinent items noted in HPI and remainder of comprehensive ROS otherwise negative.   Objective:  Physical Exam BP 107/70   Pulse 89   Ht 5\' 2"  (1.575 m)   Wt 142 lb (64.4 kg)   BMI 25.97 kg/m  CONSTITUTIONAL: Well-developed, well-nourished female in no acute distress.  HENT:  Normocephalic, atraumatic.  EYES:  Conjunctivae and EOM are normal. No scleral icterus.  NECK: Normal range of motion, supple, no masses SKIN: Skin is warm and dry. No rash noted. Not diaphoretic. No erythema. No pallor. NEUROLGIC: Alert and oriented to person, place, and time. Normal reflexes, muscle tone coordination. No cranial nerve deficit noted. PSYCHIATRIC: Normal mood and affect. Normal behavior. Normal judgment and thought content. CARDIOVASCULAR: Normal heart rate noted RESPIRATORY: Effort and breath sounds normal, no problems with respiration noted BREAST: Skin normal in appearance.-recent mamogram ABDOMEN: Soft, no distention noted.  Mild suprapubic tenderness PELVIC: Normal appearing external genitalia; normal appearing vaginal mucosa and cervix.  No abnormal discharge noted.  Normal uterine size, no other palpable masses, no uterine or adnexal tenderness. MUSCULOSKELETAL: Normal range of motion. No edema noted.  Labs and Imaging Mm Screening Breast Tomo Bilateral  Result Date: 12/01/2016 CLINICAL DATA:  Screening. EXAM: 2D DIGITAL SCREENING BILATERAL MAMMOGRAM WITH CAD AND ADJUNCT TOMO COMPARISON:  None. ACR Breast Density Category c: The breast tissue is heterogeneously dense, which may obscure small masses FINDINGS: There are no findings suspicious for malignancy. Images were processed with CAD. IMPRESSION: No mammographic evidence of malignancy. A result letter of this screening mammogram will be mailed directly to the patient. RECOMMENDATION: Screening mammogram in one year. (Code:SM-B-01Y) BI-RADS CATEGORY  1: Negative. Electronically Signed   By: Elberta Fortis M.D.   On: 12/01/2016 09:45   Results for orders placed or performed in visit on 12/15/16 (from the past  24 hour(s))  POCT urinalysis dipstick     Status: Abnormal   Collection Time: 12/15/16  8:20 AM  Result Value Ref Range   Color, UA cream    Clarity, UA cloudy    Glucose, UA negative    Bilirubin, UA negative    Ketones, UA positive    Spec  Grav, UA 1.015 1.010 - 1.025   Blood, UA large    pH, UA 6.5 5.0 - 8.0   Protein, UA positive    Urobilinogen, UA 0.2 0.2 or 1.0 E.U./dL   Nitrite, UA positive    Leukocytes, UA Moderate (2+) (A) Negative    Assessment & Plan:  1. Abnormal urine odor - POCT urinalysis dipstick - Urine Culture  2. Hematuria, unspecified type - Urine Culture  3. Urinary tract infection with hematuria, site unspecified - Treating for UTI, this patient's 3rd in this year. Discussed possible suppressive therapy if she has another. She reports this usually happens after she has sex and forgets to pee afterwards. - sulfamethoxazole-trimethoprim (BACTRIM DS,SEPTRA DS) 800-160 MG tablet; Take 1 tablet by mouth 2 (two) times daily.  Dispense: 10 tablet; Refill: 0  4. IUD (intrauterine device) in place Strings are appropriate  5. Well woman exam with routine gynecological exam - Routine screens today - Lipid panel - Hemoglobin A1c - Genetic Screening - discussed healthy diet and exercise  6. Former smoker Still quit!!   Routine preventative health maintenance measures emphasized. Please refer to After Visit Summary for other counseling recommendations.   Return in about 1 year (around 12/15/2017) for Yearly wellness exam. Billed as [phone] for preventative services and [phone] for UTI complaint.

## 2016-12-15 NOTE — Addendum Note (Signed)
Addended by: Geanie BerlinNEWTON, Caydon Feasel N on: 12/15/2016 11:24 AM   Modules accepted: Orders

## 2016-12-16 ENCOUNTER — Telehealth: Payer: Self-pay

## 2016-12-16 LAB — LIPID PANEL
CHOLESTEROL TOTAL: 241 mg/dL — AB (ref 100–199)
Chol/HDL Ratio: 4.9 ratio — ABNORMAL HIGH (ref 0.0–4.4)
HDL: 49 mg/dL (ref >39)
LDL CALC: 174 mg/dL — AB (ref 0–99)
Triglycerides: 88 mg/dL (ref 0–149)
VLDL Cholesterol Cal: 18 mg/dL (ref 5–40)

## 2016-12-16 LAB — CYTOLOGY - PAP
Diagnosis: NEGATIVE
HPV: NOT DETECTED

## 2016-12-16 LAB — HEMOGLOBIN A1C
ESTIMATED AVERAGE GLUCOSE: 100 mg/dL
Hgb A1c MFr Bld: 5.1 % (ref 4.8–5.6)

## 2016-12-16 NOTE — Telephone Encounter (Signed)
Informed patient of lab results and recommendations. °

## 2016-12-19 LAB — URINE CULTURE

## 2017-01-19 ENCOUNTER — Telehealth: Payer: Self-pay

## 2017-01-19 DIAGNOSIS — B379 Candidiasis, unspecified: Secondary | ICD-10-CM

## 2017-01-19 MED ORDER — FLUCONAZOLE 150 MG PO TABS: 150.0000 mg | ORAL_TABLET | Freq: Once | ORAL | 0 refills | Status: AC

## 2017-01-19 NOTE — Telephone Encounter (Signed)
Pt calls complaining of having vaginal itching, with thick white discharge. States that she just got back from a cruise. She is requesting that diflucan be sent to the pharmacy. Per protocol rx sent.

## 2017-01-25 ENCOUNTER — Encounter: Payer: Self-pay | Admitting: *Deleted

## 2017-03-03 ENCOUNTER — Telehealth: Payer: Self-pay

## 2017-03-03 DIAGNOSIS — B379 Candidiasis, unspecified: Secondary | ICD-10-CM

## 2017-03-03 MED ORDER — FLUCONAZOLE 150 MG PO TABS: 150.0000 mg | ORAL_TABLET | Freq: Once | ORAL | 0 refills | Status: AC

## 2017-03-03 NOTE — Telephone Encounter (Signed)
Pt left message requesting rx for diflucan. Rx sent to pharmacy

## 2017-06-08 ENCOUNTER — Other Ambulatory Visit: Payer: Self-pay

## 2017-06-08 MED ORDER — FLUCONAZOLE 150 MG PO TABS: 150.0000 mg | ORAL_TABLET | Freq: Once | ORAL | 2 refills | Status: AC

## 2017-06-08 NOTE — Telephone Encounter (Signed)
Call in diflucan for patient she thinks she has a yeast infection.

## 2017-10-20 ENCOUNTER — Encounter: Payer: Self-pay | Admitting: Radiology

## 2018-01-06 ENCOUNTER — Other Ambulatory Visit: Payer: Self-pay | Admitting: Obstetrics & Gynecology

## 2018-01-06 DIAGNOSIS — Z1231 Encounter for screening mammogram for malignant neoplasm of breast: Secondary | ICD-10-CM

## 2018-01-30 ENCOUNTER — Other Ambulatory Visit: Payer: Self-pay

## 2018-01-30 MED ORDER — FLUCONAZOLE 150 MG PO TABS: 150.0000 mg | ORAL_TABLET | Freq: Once | ORAL | 0 refills | Status: AC

## 2018-01-30 NOTE — Telephone Encounter (Signed)
Patient is requesting a diflucan be sent in [facility].

## 2018-02-13 ENCOUNTER — Ambulatory Visit
Admission: RE | Admit: 2018-02-13 | Discharge: 2018-02-13 | Disposition: A | Discharge status: Home / Self Care | Payer: Managed Care, Other (non HMO) | Source: Ambulatory Visit | Attending: Obstetrics & Gynecology | Admitting: Obstetrics & Gynecology

## 2018-02-13 DIAGNOSIS — Z1231 Encounter for screening mammogram for malignant neoplasm of breast: Secondary | ICD-10-CM

## 2018-03-20 ENCOUNTER — Encounter: Payer: Self-pay | Admitting: Obstetrics & Gynecology

## 2018-03-20 ENCOUNTER — Ambulatory Visit (INDEPENDENT_AMBULATORY_CARE_PROVIDER_SITE_OTHER): Payer: Managed Care, Other (non HMO) | Admitting: Obstetrics & Gynecology

## 2018-03-20 VITALS — BP 114/78 | HR 71 | Wt 159.0 lb

## 2018-03-20 DIAGNOSIS — Z30432 Encounter for removal of intrauterine contraceptive device: Secondary | ICD-10-CM

## 2018-03-20 DIAGNOSIS — Z113 Encounter for screening for infections with a predominantly sexual mode of transmission: Secondary | ICD-10-CM

## 2018-03-20 DIAGNOSIS — Z01419 Encounter for gynecological examination (general) (routine) without abnormal findings: Secondary | ICD-10-CM | POA: Diagnosis not present

## 2018-03-20 DIAGNOSIS — Z124 Encounter for screening for malignant neoplasm of cervix: Secondary | ICD-10-CM | POA: Diagnosis not present

## 2018-03-20 DIAGNOSIS — N898 Other specified noninflammatory disorders of vagina: Secondary | ICD-10-CM

## 2018-03-20 DIAGNOSIS — Z1151 Encounter for screening for human papillomavirus (HPV): Secondary | ICD-10-CM | POA: Diagnosis not present

## 2018-03-20 NOTE — Patient Instructions (Signed)
Preventive Care 40-64 Years, Female Preventive care refers to lifestyle choices and visits with your health care provider that can promote health and wellness. What does preventive care include?  A yearly physical exam. This is also called an annual well check.  Dental exams once or twice a year.  Routine eye exams. Ask your health care provider how often you should have your eyes checked.  Personal lifestyle choices, including: ? Daily care of your teeth and gums. ? Regular physical activity. ? Eating a healthy diet. ? Avoiding tobacco and drug use. ? Limiting alcohol use. ? Practicing safe sex. ? Taking low-dose aspirin daily starting at age 58. ? Taking vitamin and mineral supplements as recommended by your health care provider. What happens during an annual well check? The services and screenings done by your health care provider during your annual well check will depend on your age, overall health, lifestyle risk factors, and family history of disease. Counseling Your health care provider may ask you questions about your:  Alcohol use.  Tobacco use.  Drug use.  Emotional well-being.  Home and relationship well-being.  Sexual activity.  Eating habits.  Work and work Statistician.  Method of birth control.  Menstrual cycle.  Pregnancy history.  Screening You may have the following tests or measurements:  Height, weight, and BMI.  Blood pressure.  Lipid and cholesterol levels. These may be checked every 5 years, or more frequently if you are over 81 years old.  Skin check.  Lung cancer screening. You may have this screening every year starting at age 78 if you have a 30-pack-year history of smoking and currently smoke or have quit within the past 15 years.  Fecal occult blood test (FOBT) of the stool. You may have this test every year starting at age 65.  Flexible sigmoidoscopy or colonoscopy. You may have a sigmoidoscopy every 5 years or a colonoscopy  every 10 years starting at age 30.  Hepatitis C blood test.  Hepatitis B blood test.  Sexually transmitted disease (STD) testing.  Diabetes screening. This is done by checking your blood sugar (glucose) after you have not eaten for a while (fasting). You may have this done every 1-3 years.  Mammogram. This may be done every 1-2 years. Talk to your health care provider about when you should start having regular mammograms. This may depend on whether you have a family history of breast cancer.  BRCA-related cancer screening. This may be done if you have a family history of breast, ovarian, tubal, or peritoneal cancers.  Pelvic exam and Pap test. This may be done every 3 years starting at age 80. Starting at age 36, this may be done every 5 years if you have a Pap test in combination with an HPV test.  Bone density scan. This is done to screen for osteoporosis. You may have this scan if you are at high risk for osteoporosis.  Discuss your test results, treatment options, and if necessary, the need for more tests with your health care provider. Vaccines Your health care provider may recommend certain vaccines, such as:  Influenza vaccine. This is recommended every year.  Tetanus, diphtheria, and acellular pertussis (Tdap, Td) vaccine. You may need a Td booster every 10 years.  Varicella vaccine. You may need this if you have not been vaccinated.  Zoster vaccine. You may need this after age 5.  Measles, mumps, and rubella (MMR) vaccine. You may need at least one dose of MMR if you were born in  1956 or later. You may also need a second dose.  Pneumococcal 13-valent conjugate (PCV13) vaccine. You may need this if you have certain conditions and were not previously vaccinated.  Pneumococcal polysaccharide (PPSV23) vaccine. You may need one or two doses if you smoke cigarettes or if you have certain conditions.  Meningococcal vaccine. You may need this if you have certain  conditions.  Hepatitis A vaccine. You may need this if you have certain conditions or if you travel or work in places where you may be exposed to hepatitis A.  Hepatitis B vaccine. You may need this if you have certain conditions or if you travel or work in places where you may be exposed to hepatitis B.  Haemophilus influenzae type b (Hib) vaccine. You may need this if you have certain conditions.  Talk to your health care provider about which screenings and vaccines you need and how often you need them. This information is not intended to replace advice given to you by your health care provider. Make sure you discuss any questions you have with your health care provider. Document Released: 05/23/2015 Document Revised: 01/14/2016 Document Reviewed: 02/25/2015 Elsevier Interactive Patient Education  2018 Elsevier Inc.  

## 2018-03-20 NOTE — Progress Notes (Signed)
GYNECOLOGY ANNUAL PREVENTATIVE CARE ENCOUNTER NOTE  Subjective:   Yvette Adams is a 42 y.o. G81P1011 female here for a routine annual gynecologic exam.  Current complaints: desires IUD removal. Has been in place for one year, but has bothersome side effects of irregular bleeding, abnormal vaginal discharge (she wants evaluation for this today) and feeling "weird". Also husband just got a vasectomy.  Denies current abnormal vaginal bleeding, pelvic pain, problems with intercourse or other gynecologic concerns.    Gynecologic History No LMP recorded. (Menstrual status: IUD). Contraception: vasectomy Last Pap: 12/15/2016. Results were: normal with negative HPV Last mammogram: 02/13/2018. Results were: normal  Obstetric History OB History  Gravida Para Term Preterm AB Living  2 1 1  0 1 1  SAB TAB Ectopic Multiple Live Births  1 0 0 0 1    # Outcome Date GA Lbr Len/2nd Weight Sex Delivery Anes PTL Lv  2 Term 2005    F Vag-Spont   LIV  1 SAB             Past Medical History:  Diagnosis Date  . History of abnormal Pap smear   . History of herpes simplex type 2 infection   . Seasonal allergies   . Smoker   . Strep throat   . SVD (spontaneous vaginal delivery)    x 1    Past Surgical History:  Procedure Laterality Date  . DILATION AND CURETTAGE OF UTERUS    . INTRAUTERINE DEVICE (IUD) INSERTION  04/13/2012   Procedure: INTRAUTERINE DEVICE (IUD) INSERTION;  Surgeon: Reva Bores, MD;  Location: [facility];  Service: Gynecology;  Laterality: N/A;  . IUD REMOVAL  04/13/2012   Procedure: INTRAUTERINE DEVICE (IUD) REMOVAL;  Surgeon: Reva Bores, MD;  Location: [facility];  Service: Gynecology;  Laterality: N/A;  . WISDOM TOOTH EXTRACTION     x4    Current Outpatient Medications on File Prior to Visit  Medication Sig Dispense Refill  . desloratadine (CLARINEX) 5 MG tablet Take 5 mg by mouth daily.    . montelukast (SINGULAIR) 10 MG tablet Take 10 mg by mouth at bedtime.    .  magnesium 30 MG tablet Take 30 mg by mouth 2 (two) times daily.    . Multiple Vitamin (MULTIVITAMIN) capsule Take 1 capsule by mouth daily.    Marland Kitchen omeprazole (PRILOSEC) 20 MG capsule Take 20 mg by mouth daily.    Marland Kitchen POTASSIUM PO Take by mouth.     Current Facility-Administered Medications on File Prior to Visit  Medication Dose Route Frequency Provider Last Rate Last Dose  . levonorgestrel (MIRENA) 20 MCG/24HR IUD   Intrauterine Once Kriste Broman A, MD        Allergies  Allergen Reactions  . Erythromycin     ABDOMINAL CRAMPING OK with ZPAK    Social History:  reports that she quit smoking about 4 years ago. Her smoking use included cigarettes. She smoked 0.00 packs per day for 20.00 years. She has never used smokeless tobacco. She reports that she drinks alcohol. She reports that she does not use drugs.  Family History  Problem Relation Age of Onset  . Asthma Mother   . Hypertension Mother   . Alcohol abuse Father   . Diabetes Maternal Grandmother   . Pancreatic cancer Maternal Grandmother        pancreatic  . Colon cancer Maternal Uncle   . Colon cancer Cousin        Maternal side  The following portions of the patient's history were reviewed and updated as appropriate: allergies, current medications, past family history, past medical history, past social history, past surgical history and problem list.  Review of Systems Pertinent items noted in HPI and remainder of comprehensive ROS otherwise negative.   Objective:  BP 114/78   Pulse 71   Wt 159 lb (72.1 kg)   BMI 29.08 kg/m  CONSTITUTIONAL: Well-developed, well-nourished female in no acute distress.  HENT:  Normocephalic, atraumatic, External right and left ear normal. Oropharynx is clear and moist EYES: Conjunctivae and EOM are normal. Pupils are equal, round, and reactive to light. No scleral icterus.  NECK: Normal range of motion, supple, no masses.  Normal thyroid.  SKIN: Skin is warm and dry. No rash noted.  Not diaphoretic. No erythema. No pallor. MUSCULOSKELETAL: Normal range of motion. No tenderness.  No cyanosis, clubbing, or edema.  2+ distal pulses. NEUROLOGIC: Alert and oriented to person, place, and time. Normal reflexes, muscle tone coordination. No cranial nerve deficit noted. PSYCHIATRIC: Normal mood and affect. Normal behavior. Normal judgment and thought content. CARDIOVASCULAR: Normal heart rate noted, regular rhythm RESPIRATORY: Clear to auscultation bilaterally. Effort and breath sounds normal, no problems with respiration noted. BREASTS: Symmetric in size. No masses, skin changes, nipple drainage, or lymphadenopathy. ABDOMEN: Soft, normal bowel sounds, no distention noted.  No tenderness, rebound or guarding.  PELVIC: Normal appearing external genitalia; normal appearing vaginal mucosa and cervix.  Thick, white discharge noted, testing sample.  Pap smear obtained. IUD strings seen.  Normal uterine size, no other palpable masses, no uterine or adnexal tenderness.  IUD Removal  Patient identified, informed consent performed, consent signed.  Patient was in the dorsal lithotomy position, normal external genitalia was noted.  A speculum was placed in the patient's vagina, normal discharge was noted, no lesions. The cervix was visualized, no lesions, white vaginal discharge noted.  The strings of the IUD were grasped and pulled using ring forceps. The IUD was removed in its entirety.  Patient tolerated the procedure well.     Assessment and Plan:  1. Well woman exam with routine gynecological exam Will follow up results of pap smear and manage accordingly. - Cytology - PAP  2. Encounter for IUD removal Mirena removed, husband has vasectomy. Was cautioned about possible irregular or heavy bleeding after progestin IUD removal  3. Vaginal discharge - Cervicovaginal ancillary only done, will follow results and manage accordingly.  Mammogram is up todate Routine preventative health  maintenance measures emphasized. Please refer to After Visit Summary for other counseling recommendations.    Jaynie Collins, MD, FACOG Obstetrician & Gynecologist, [facility] for [employer], [facility]

## 2018-03-22 ENCOUNTER — Encounter: Payer: Self-pay | Admitting: *Deleted

## 2018-03-22 LAB — CERVICOVAGINAL ANCILLARY ONLY
Bacterial vaginitis: NEGATIVE
CHLAMYDIA, DNA PROBE: NEGATIVE
Candida vaginitis: NEGATIVE
NEISSERIA GONORRHEA: NEGATIVE
TRICH (WINDOWPATH): NEGATIVE

## 2018-03-23 LAB — CYTOLOGY - PAP
Diagnosis: NEGATIVE
HPV (WINDOPATH): NOT DETECTED

## 2018-05-17 ENCOUNTER — Other Ambulatory Visit: Payer: Self-pay

## 2018-05-17 MED ORDER — FLUCONAZOLE 150 MG PO TABS: 150.0000 mg | ORAL_TABLET | Freq: Once | ORAL | 2 refills | Status: AC

## 2018-05-17 NOTE — Telephone Encounter (Signed)
Patient thinks she has a yeast infection and would like a diflucan called in to [facility] on [address] [place]

## 2018-07-28 ENCOUNTER — Other Ambulatory Visit: Payer: Self-pay | Admitting: *Deleted

## 2018-07-28 MED ORDER — FLUCONAZOLE 150 MG PO TABS
150.0000 mg | ORAL_TABLET | Freq: Once | ORAL | 1 refills | Status: AC
Start: 2018-07-28 — End: 2018-07-28

## 2018-08-08 ENCOUNTER — Other Ambulatory Visit: Payer: Self-pay

## 2018-08-08 MED ORDER — FLUCONAZOLE 150 MG PO TABS: 150.0000 mg | ORAL_TABLET | Freq: Once | ORAL | 0 refills | Status: AC

## 2018-08-08 NOTE — Telephone Encounter (Signed)
Patient is requesting a diflucan be called in for her. She reports working in the garden over the weekend and had a lot of sweating. She reports having small amount of discharge and itching.

## 2018-12-27 ENCOUNTER — Other Ambulatory Visit: Payer: Self-pay | Admitting: Obstetrics & Gynecology

## 2018-12-27 DIAGNOSIS — Z1231 Encounter for screening mammogram for malignant neoplasm of breast: Secondary | ICD-10-CM

## 2019-02-15 ENCOUNTER — Other Ambulatory Visit: Payer: Self-pay

## 2019-02-15 ENCOUNTER — Ambulatory Visit
Admission: RE | Admit: 2019-02-15 | Discharge: 2019-02-15 | Disposition: A | Discharge status: Home / Self Care | Payer: Managed Care, Other (non HMO) | Source: Ambulatory Visit | Attending: Obstetrics & Gynecology | Admitting: Obstetrics & Gynecology

## 2019-02-15 DIAGNOSIS — Z1231 Encounter for screening mammogram for malignant neoplasm of breast: Secondary | ICD-10-CM

## 2019-02-20 ENCOUNTER — Encounter: Payer: Self-pay | Admitting: Radiology

## 2019-03-01 ENCOUNTER — Other Ambulatory Visit: Payer: Self-pay

## 2019-03-01 DIAGNOSIS — Z20822 Contact with and (suspected) exposure to covid-19: Secondary | ICD-10-CM

## 2019-03-03 LAB — NOVEL CORONAVIRUS, NAA: SARS-CoV-2, NAA: NOT DETECTED

## 2019-03-09 ENCOUNTER — Other Ambulatory Visit: Payer: Self-pay | Admitting: *Deleted

## 2019-03-09 DIAGNOSIS — Z20822 Contact with and (suspected) exposure to covid-19: Secondary | ICD-10-CM

## 2019-03-11 LAB — NOVEL CORONAVIRUS, NAA: SARS-CoV-2, NAA: NOT DETECTED

## 2019-04-10 ENCOUNTER — Ambulatory Visit (INDEPENDENT_AMBULATORY_CARE_PROVIDER_SITE_OTHER): Payer: 59 | Admitting: Obstetrics & Gynecology

## 2019-04-10 ENCOUNTER — Encounter: Payer: Self-pay | Admitting: Obstetrics & Gynecology

## 2019-04-10 ENCOUNTER — Other Ambulatory Visit: Payer: Self-pay

## 2019-04-10 VITALS — BP 106/74 | HR 71 | Wt 161.2 lb

## 2019-04-10 DIAGNOSIS — Z113 Encounter for screening for infections with a predominantly sexual mode of transmission: Secondary | ICD-10-CM | POA: Diagnosis not present

## 2019-04-10 DIAGNOSIS — Z124 Encounter for screening for malignant neoplasm of cervix: Secondary | ICD-10-CM

## 2019-04-10 DIAGNOSIS — B360 Pityriasis versicolor: Secondary | ICD-10-CM

## 2019-04-10 DIAGNOSIS — Z23 Encounter for immunization: Secondary | ICD-10-CM | POA: Diagnosis not present

## 2019-04-10 DIAGNOSIS — Z1151 Encounter for screening for human papillomavirus (HPV): Secondary | ICD-10-CM

## 2019-04-10 DIAGNOSIS — Z01419 Encounter for gynecological examination (general) (routine) without abnormal findings: Secondary | ICD-10-CM

## 2019-04-10 DIAGNOSIS — N898 Other specified noninflammatory disorders of vagina: Secondary | ICD-10-CM

## 2019-04-10 MED ORDER — SELENIUM SULFIDE 2.5 % EX LOTN: 1.0000 "application " | TOPICAL_LOTION | Freq: Every day | CUTANEOUS | 12 refills | Status: DC | PRN

## 2019-04-10 MED ORDER — TETANUS-DIPHTH-ACELL PERTUSSIS 5-2.5-18.5 LF-MCG/0.5 IM SUSP
0.5000 mL | Freq: Once | INTRAMUSCULAR | Status: AC
  Administered 2019-04-10: 0.5 mL via INTRAMUSCULAR

## 2019-04-10 NOTE — Patient Instructions (Signed)

## 2019-04-10 NOTE — Progress Notes (Signed)
GYNECOLOGY ANNUAL PREVENTATIVE CARE ENCOUNTER NOTE  History:     Yvette Adams is a 43 y.o. G34P1011 female here for a routine annual gynecologic exam.  Current complaints: off and of vulvar irritation and occasional abnormal vaginal discharge.  She desires evaluation for the discharge and annual STI testing. Also desires other preventative health maintenance labs.  Uses selenium sulfide for tinea versicolor, desires refill of this.   Denies abnormal vaginal bleeding, discharge, pelvic pain, problems with intercourse or other gynecologic concerns.    Gynecologic History Patient's last menstrual period was 04/05/2019. Contraception: vasectomy Last Pap: 03/20/2018. Results were: normal with negative HPV Last mammogram: 02/12/2019. Results were: normal  Obstetric History OB History  Gravida Para Term Preterm AB Living  2 1 1  0 1 1  SAB TAB Ectopic Multiple Live Births  1 0 0 0 1    # Outcome Date GA Lbr Len/2nd Weight Sex Delivery Anes PTL Lv  2 Term 2005    F Vag-Spont   LIV  1 SAB             Past Medical History:  Diagnosis Date  . History of abnormal Pap smear   . History of herpes simplex type 2 infection   . Seasonal allergies   . Smoker   . Strep throat   . SVD (spontaneous vaginal delivery)    x 1  . Tinea versicolor     Past Surgical History:  Procedure Laterality Date  . DILATION AND CURETTAGE OF UTERUS    . INTRAUTERINE DEVICE (IUD) INSERTION  04/13/2012   Procedure: INTRAUTERINE DEVICE (IUD) INSERTION;  Surgeon: Donnamae Jude, MD;  Location: [facility];  Service: Gynecology;  Laterality: N/A;  . IUD REMOVAL  04/13/2012   Procedure: INTRAUTERINE DEVICE (IUD) REMOVAL;  Surgeon: Donnamae Jude, MD;  Location: [facility];  Service: Gynecology;  Laterality: N/A;  . WISDOM TOOTH EXTRACTION     x4    Current Outpatient Medications on File Prior to Visit  Medication Sig Dispense Refill  . desloratadine (CLARINEX) 5 MG tablet Take 5 mg by mouth daily.    . magnesium 30  MG tablet Take 30 mg by mouth 2 (two) times daily.    . montelukast (SINGULAIR) 10 MG tablet Take 10 mg by mouth at bedtime.    . Multiple Vitamin (MULTIVITAMIN) capsule Take 1 capsule by mouth daily.    Marland Kitchen omeprazole (PRILOSEC) 20 MG capsule Take 20 mg by mouth daily.    Marland Kitchen POTASSIUM PO Take by mouth.     No current facility-administered medications on file prior to visit.     Allergies  Allergen Reactions  . Erythromycin     ABDOMINAL CRAMPING OK with ZPAK    Social History:  reports that she quit smoking about 5 years ago. Her smoking use included cigarettes. She smoked 0.00 packs per day for 20.00 years. She has never used smokeless tobacco. She reports current alcohol use. She reports that she does not use drugs.  Family History  Problem Relation Age of Onset  . Asthma Mother   . Hypertension Mother   . Alcohol abuse Father   . Diabetes Maternal Grandmother   . Pancreatic cancer Maternal Grandmother        pancreatic  . Colon cancer Maternal Uncle   . Colon cancer Cousin        Maternal side  . Breast cancer Neg Hx     The following portions of the patient's history were reviewed  and updated as appropriate: allergies, current medications, past family history, past medical history, past social history, past surgical history and problem list.  Review of Systems Pertinent items noted in HPI and remainder of comprehensive ROS otherwise negative.  Physical Exam:  BP 106/74   Pulse 71   Wt 161 lb 3.2 oz (73.1 kg)   LMP 04/05/2019   BMI 29.48 kg/m  CONSTITUTIONAL: Well-developed, well-nourished female in no acute distress.  HENT:  Normocephalic, atraumatic, External right and left ear normal. Oropharynx is clear and moist EYES: Conjunctivae and EOM are normal. Pupils are equal, round, and reactive to light. No scleral icterus.  NECK: Normal range of motion, supple, no masses.  Normal thyroid.  SKIN: Skin is warm and dry.  Not diaphoretic. No erythema. MUSCULOSKELETAL:  Normal range of motion. No tenderness.  No cyanosis, clubbing, or edema.  2+ distal pulses. NEUROLOGIC: Alert and oriented to person, place, and time. Normal reflexes, muscle tone coordination.  PSYCHIATRIC: Normal mood and affect. Normal behavior. Normal judgment and thought content. CARDIOVASCULAR: Normal heart rate noted, regular rhythm RESPIRATORY: Clear to auscultation bilaterally. Effort and breath sounds normal, no problems with respiration noted. BREASTS: Symmetric in size. No masses, tenderness, skin changes, nipple drainage, or lymphadenopathy bilaterally. ABDOMEN: Soft, no distention noted.  No tenderness, rebound or guarding.  PELVIC: Normal appearing external genitalia without lesions or irritation. Normal urethral meatus; normal appearing vaginal mucosa and cervix.  Thick white discharge noted, testing sample obtained.  Pap smear obtained; there was some bleeding after pap.  Normal uterine size, no other palpable masses, no uterine or adnexal tenderness.   Assessment and Plan:    1. Need for Tdap vaccination Tdap vaccine given. Declined Flu vaccine. - Tdap (BOOSTRIX) injection 0.5 mL  2. Tinea versicolor Refilled this medication as per her request. - selenium sulfide (SELSUN) 2.5 % shampoo; Apply 1 application topically daily as needed for irritation.  Dispense: 118 mL; Refill: 12  3. Well woman exam with routine gynecological exam Preventative health maintenance labs and pap done, will follow up results and manage accordingly. - Cytology - PAP( [facility]) - Hepatitis B surface antigen - Hepatitis C antibody - HIV antibody - RPR - Cervicovaginal ancillary only - CBC - TSH - Hemoglobin A1c - Vitamin D (25 hydroxy) - Lipid panel - CMP Mammogram is up to date. For her vulvar itching, if no pathogen is seen, will treat with topical corticosteroid cream. Routine preventative health maintenance measures emphasized. Please refer to After Visit Summary for other counseling  recommendations.      Jaynie Collins, MD, FACOG Obstetrician & Gynecologist, [facility] for [employer], [facility]

## 2019-04-11 LAB — CBC
Hematocrit: 43.1 % (ref 34.0–46.6)
Hemoglobin: 14 g/dL (ref 11.1–15.9)
MCH: 29.9 pg (ref 26.6–33.0)
MCHC: 32.5 g/dL (ref 31.5–35.7)
MCV: 92 fL (ref 79–97)
Platelets: 272 10*3/uL (ref 150–450)
RBC: 4.68 x10E6/uL (ref 3.77–5.28)
RDW: 12.6 % (ref 11.7–15.4)
WBC: 5.6 10*3/uL (ref 3.4–10.8)

## 2019-04-11 LAB — COMPREHENSIVE METABOLIC PANEL
ALT: 28 IU/L (ref 0–32)
AST: 20 IU/L (ref 0–40)
Albumin/Globulin Ratio: 2 (ref 1.2–2.2)
Albumin: 4.4 g/dL (ref 3.8–4.8)
Alkaline Phosphatase: 86 IU/L (ref 39–117)
BUN/Creatinine Ratio: 18 (ref 9–23)
BUN: 16 mg/dL (ref 6–24)
Bilirubin Total: 0.6 mg/dL (ref 0.0–1.2)
CO2: 24 mmol/L (ref 20–29)
Calcium: 9.8 mg/dL (ref 8.7–10.2)
Chloride: 101 mmol/L (ref 96–106)
Creatinine, Ser: 0.9 mg/dL (ref 0.57–1.00)
GFR calc Af Amer: 91 mL/min/{1.73_m2} (ref >59)
GFR calc non Af Amer: 79 mL/min/{1.73_m2} (ref >59)
Globulin, Total: 2.2 g/dL (ref 1.5–4.5)
Glucose: 88 mg/dL (ref 65–99)
Potassium: 4.5 mmol/L (ref 3.5–5.2)
Sodium: 131 mmol/L — ABNORMAL LOW (ref 134–144)
Total Protein: 6.6 g/dL (ref 6.0–8.5)

## 2019-04-11 LAB — HIV ANTIBODY (ROUTINE TESTING W REFLEX): HIV Screen 4th Generation wRfx: NONREACTIVE

## 2019-04-11 LAB — HEMOGLOBIN A1C
Est. average glucose Bld gHb Est-mCnc: 105 mg/dL
Hgb A1c MFr Bld: 5.3 % (ref 4.8–5.6)

## 2019-04-11 LAB — TSH: TSH: 2.38 u[IU]/mL (ref 0.450–4.500)

## 2019-04-11 LAB — HEPATITIS C ANTIBODY: Hep C Virus Ab: <0.1 s/co ratio (ref 0.0–0.9)

## 2019-04-11 LAB — CERVICOVAGINAL ANCILLARY ONLY
Bacterial Vaginitis (gardnerella): NEGATIVE
Candida Glabrata: NEGATIVE
Candida Vaginitis: NEGATIVE
Chlamydia: NEGATIVE
Comment: NEGATIVE
Comment: NEGATIVE
Comment: NEGATIVE
Comment: NEGATIVE
Comment: NEGATIVE
Comment: NORMAL
Neisseria Gonorrhea: NEGATIVE
Trichomonas: NEGATIVE

## 2019-04-11 LAB — LIPID PANEL
Chol/HDL Ratio: 5.6 ratio — ABNORMAL HIGH (ref 0.0–4.4)
Cholesterol, Total: 281 mg/dL — ABNORMAL HIGH (ref 100–199)
HDL: 50 mg/dL (ref >39)
LDL Chol Calc (NIH): 204 mg/dL — ABNORMAL HIGH (ref 0–99)
Triglycerides: 147 mg/dL (ref 0–149)
VLDL Cholesterol Cal: 27 mg/dL (ref 5–40)

## 2019-04-11 LAB — VITAMIN D 25 HYDROXY (VIT D DEFICIENCY, FRACTURES): Vit D, 25-Hydroxy: 16.6 ng/mL — ABNORMAL LOW (ref 30.0–100.0)

## 2019-04-11 LAB — RPR: RPR Ser Ql: NONREACTIVE

## 2019-04-11 LAB — HEPATITIS B SURFACE ANTIGEN: Hepatitis B Surface Ag: NEGATIVE

## 2019-04-12 LAB — CYTOLOGY - PAP
Comment: NEGATIVE
Diagnosis: NEGATIVE
High risk HPV: NEGATIVE

## 2019-04-16 DIAGNOSIS — L299 Pruritus, unspecified: Secondary | ICD-10-CM

## 2019-04-18 MED ORDER — TRIAMCINOLONE ACETONIDE 0.5 % EX OINT: 1.0000 "application " | TOPICAL_OINTMENT | Freq: Two times a day (BID) | CUTANEOUS | 2 refills | Status: DC | PRN

## 2019-05-02 ENCOUNTER — Ambulatory Visit: Payer: 59 | Attending: Internal Medicine

## 2019-05-02 DIAGNOSIS — Z20822 Contact with and (suspected) exposure to covid-19: Secondary | ICD-10-CM

## 2019-05-04 LAB — NOVEL CORONAVIRUS, NAA: SARS-CoV-2, NAA: NOT DETECTED

## 2019-12-26 ENCOUNTER — Other Ambulatory Visit: Payer: Self-pay | Admitting: Critical Care Medicine

## 2019-12-26 ENCOUNTER — Other Ambulatory Visit: Payer: Self-pay

## 2019-12-26 DIAGNOSIS — Z20822 Contact with and (suspected) exposure to covid-19: Secondary | ICD-10-CM

## 2019-12-27 LAB — SARS-COV-2, NAA 2 DAY TAT

## 2019-12-27 LAB — NOVEL CORONAVIRUS, NAA: SARS-CoV-2, NAA: NOT DETECTED

## 2020-03-07 ENCOUNTER — Encounter: Payer: Self-pay | Admitting: Radiology

## 2021-01-06 ENCOUNTER — Other Ambulatory Visit: Payer: Self-pay

## 2021-01-06 ENCOUNTER — Encounter: Payer: Self-pay | Admitting: Obstetrics & Gynecology

## 2021-01-06 ENCOUNTER — Ambulatory Visit (INDEPENDENT_AMBULATORY_CARE_PROVIDER_SITE_OTHER): Payer: Managed Care, Other (non HMO) | Admitting: Obstetrics & Gynecology

## 2021-01-06 VITALS — BP 115/76 | HR 84 | Ht 63.0 in | Wt 162.0 lb

## 2021-01-06 DIAGNOSIS — Z01419 Encounter for gynecological examination (general) (routine) without abnormal findings: Secondary | ICD-10-CM

## 2021-01-06 DIAGNOSIS — Z1231 Encounter for screening mammogram for malignant neoplasm of breast: Secondary | ICD-10-CM

## 2021-01-06 DIAGNOSIS — R6882 Decreased libido: Secondary | ICD-10-CM

## 2021-01-06 LAB — CBC
Hematocrit: 40.1 % (ref 34.0–46.6)
Hemoglobin: 13.2 g/dL (ref 11.1–15.9)
MCH: 29.7 pg (ref 26.6–33.0)
MCHC: 32.9 g/dL (ref 31.5–35.7)
MCV: 90 fL (ref 79–97)
Platelets: 222 10*3/uL (ref 150–450)
RBC: 4.45 x10E6/uL (ref 3.77–5.28)
RDW: 12.1 % (ref 11.7–15.4)
WBC: 6.2 10*3/uL (ref 3.4–10.8)

## 2021-01-06 LAB — HEMOGLOBIN A1C
Est. average glucose Bld gHb Est-mCnc: 105 mg/dL
Hgb A1c MFr Bld: 5.3 % (ref 4.8–5.6)

## 2021-01-06 NOTE — Progress Notes (Signed)
GYNECOLOGY ANNUAL PREVENTATIVE CARE ENCOUNTER NOTE  History:     Yvette Adams is a 45 y.o. G31P1011 female here for a routine annual gynecologic exam.  Current complaints: low libido, wants to discuss management options.   Denies abnormal vaginal bleeding, discharge, pelvic pain, problems with intercourse or other gynecologic concerns.    Gynecologic History Patient's last menstrual period was 12/15/2020 (exact date). Contraception: vasectomy Last Pap: 04/10/2019. Result was normal with negative HPV Last Mammogram: 02/15/2019.  Result was normal  Obstetric History OB History  Gravida Para Term Preterm AB Living  2 1 1  0 1 1  SAB IAB Ectopic Multiple Live Births  1 0 0 0 1    # Outcome Date GA Lbr Len/2nd Weight Sex Delivery Anes PTL Lv  2 Term 2005    F Vag-Spont   LIV  1 SAB             Past Medical History:  Diagnosis Date   History of abnormal Pap smear    History of herpes simplex type 2 infection    Seasonal allergies    Smoker    Strep throat    SVD (spontaneous vaginal delivery)    x 1   Tinea versicolor     Past Surgical History:  Procedure Laterality Date   DILATION AND CURETTAGE OF UTERUS     INTRAUTERINE DEVICE (IUD) INSERTION  04/13/2012   Procedure: INTRAUTERINE DEVICE (IUD) INSERTION;  Surgeon: 14/09/2011, MD;  Location: [facility];  Service: Gynecology;  Laterality: N/A;   IUD REMOVAL  04/13/2012   Procedure: INTRAUTERINE DEVICE (IUD) REMOVAL;  Surgeon: 14/09/2011, MD;  Location: [facility];  Service: Gynecology;  Laterality: N/A;   WISDOM TOOTH EXTRACTION     x4    Current Outpatient Medications on File Prior to Visit  Medication Sig Dispense Refill   omeprazole (PRILOSEC) 20 MG capsule Take 20 mg by mouth daily.     desloratadine (CLARINEX) 5 MG tablet Take 5 mg by mouth daily. (Patient not taking: Reported on 01/06/2021)     magnesium 30 MG tablet Take 30 mg by mouth 2 (two) times daily. (Patient not taking: Reported on 01/06/2021)      Multiple Vitamin (MULTIVITAMIN) capsule Take 1 capsule by mouth daily. (Patient not taking: Reported on 01/06/2021)     No current facility-administered medications on file prior to visit.    Allergies  Allergen Reactions   Erythromycin     ABDOMINAL CRAMPING OK with ZPAK    Social History:  reports that she quit smoking about 7 years ago. Her smoking use included cigarettes. She has never used smokeless tobacco. She reports current alcohol use. She reports that she does not use drugs.  Family History  Problem Relation Age of Onset   Asthma Mother    Hypertension Mother    Alcohol abuse Father    Diabetes Maternal Grandmother    Pancreatic cancer Maternal Grandmother        pancreatic   Colon cancer Maternal Uncle    Colon cancer Cousin        Maternal side   Breast cancer Neg Hx     The following portions of the patient's history were reviewed and updated as appropriate: allergies, current medications, past family history, past medical history, past social history, past surgical history and problem list.  Review of Systems Pertinent items noted in HPI and remainder of comprehensive ROS otherwise negative.  Physical Exam:  BP 115/76  Pulse 84   Ht 5\' 3"  (1.6 m)   Wt 162 lb (73.5 kg)   LMP 12/15/2020 (Exact Date)   BMI 28.70 kg/m  CONSTITUTIONAL: Well-developed, well-nourished female in no acute distress.  HENT:  Normocephalic, atraumatic, External right and left ear normal.  EYES: Conjunctivae and EOM are normal. Pupils are equal, round, and reactive to light. No scleral icterus.  NECK: Normal range of motion, supple, no masses.  Normal thyroid.  SKIN: Skin is warm and dry. No rash noted. Not diaphoretic. No erythema. No pallor. MUSCULOSKELETAL: Normal range of motion. No tenderness.  No cyanosis, clubbing, or edema. NEUROLOGIC: Alert and oriented to person, place, and time. Normal reflexes, muscle tone coordination.  PSYCHIATRIC: Normal mood and affect. Normal  behavior. Normal judgment and thought content. CARDIOVASCULAR: Normal heart rate noted, regular rhythm RESPIRATORY: Clear to auscultation bilaterally. Effort and breath sounds normal, no problems with respiration noted. BREASTS: Symmetric in size. No masses, tenderness, skin changes, nipple drainage, or lymphadenopathy bilaterally. Performed in the presence of a chaperone. ABDOMEN: Soft, no distention noted.  No tenderness, rebound or guarding.  PELVIC: Deferred.   Assessment and Plan:    1. Low libido Discussed Addyi, gave her information about this. Also recommended sexual counseling and other resources. She will research and let 02/14/2021 know how she wants to proceed.  2. Breast cancer screening by mammogram - MM 3D SCREEN BREAST BILATERAL; Future  3. Encounter for well woman exam Labs ordered, will follow up results and manage accordingly. - CBC - TSH - Hemoglobin A1c - Comprehensive metabolic panel - Lipid panel Routine preventative health maintenance measures emphasized. Please refer to After Visit Summary for other counseling recommendations.      [place], MD, FACOG Obstetrician & Gynecologist, [facility] for [facility]

## 2021-01-07 LAB — COMPREHENSIVE METABOLIC PANEL
ALT: 25 IU/L (ref 0–32)
AST: 26 IU/L (ref 0–40)
Albumin/Globulin Ratio: 2 (ref 1.2–2.2)
Albumin: 4.4 g/dL (ref 3.8–4.8)
Alkaline Phosphatase: 77 IU/L (ref 44–121)
BUN/Creatinine Ratio: 22 (ref 9–23)
BUN: 21 mg/dL (ref 6–24)
Bilirubin Total: 0.9 mg/dL (ref 0.0–1.2)
CO2: 20 mmol/L (ref 20–29)
Calcium: 9.2 mg/dL (ref 8.7–10.2)
Chloride: 100 mmol/L (ref 96–106)
Creatinine, Ser: 0.97 mg/dL (ref 0.57–1.00)
Globulin, Total: 2.2 g/dL (ref 1.5–4.5)
Glucose: 88 mg/dL (ref 65–99)
Potassium: 4.4 mmol/L (ref 3.5–5.2)
Sodium: 138 mmol/L (ref 134–144)
Total Protein: 6.6 g/dL (ref 6.0–8.5)
eGFR: 74 mL/min/{1.73_m2} (ref >59)

## 2021-01-07 LAB — TSH: TSH: 2.11 u[IU]/mL (ref 0.450–4.500)

## 2021-01-07 LAB — LIPID PANEL
Chol/HDL Ratio: 6.2 ratio — ABNORMAL HIGH (ref 0.0–4.4)
Cholesterol, Total: 281 mg/dL — ABNORMAL HIGH (ref 100–199)
HDL: 45 mg/dL (ref >39)
LDL Chol Calc (NIH): 194 mg/dL — ABNORMAL HIGH (ref 0–99)
Triglycerides: 217 mg/dL — ABNORMAL HIGH (ref 0–149)
VLDL Cholesterol Cal: 42 mg/dL — ABNORMAL HIGH (ref 5–40)

## 2021-02-02 ENCOUNTER — Other Ambulatory Visit: Payer: Self-pay

## 2021-02-02 ENCOUNTER — Ambulatory Visit
Admission: RE | Admit: 2021-02-02 | Discharge: 2021-02-02 | Disposition: A | Discharge status: Home / Self Care | Payer: Managed Care, Other (non HMO) | Source: Ambulatory Visit | Attending: Obstetrics & Gynecology | Admitting: Obstetrics & Gynecology

## 2021-02-02 DIAGNOSIS — Z1231 Encounter for screening mammogram for malignant neoplasm of breast: Secondary | ICD-10-CM | POA: Insufficient documentation

## 2021-05-25 ENCOUNTER — Other Ambulatory Visit: Payer: Self-pay | Admitting: Internal Medicine

## 2021-05-25 DIAGNOSIS — N6310 Unspecified lump in the right breast, unspecified quadrant: Secondary | ICD-10-CM

## 2021-06-10 ENCOUNTER — Other Ambulatory Visit: Payer: Self-pay

## 2021-06-10 ENCOUNTER — Ambulatory Visit
Admission: RE | Admit: 2021-06-10 | Discharge: 2021-06-10 | Disposition: A | Discharge status: Home / Self Care | Payer: Managed Care, Other (non HMO) | Source: Ambulatory Visit | Attending: Internal Medicine | Admitting: Internal Medicine

## 2021-06-10 DIAGNOSIS — N6310 Unspecified lump in the right breast, unspecified quadrant: Secondary | ICD-10-CM | POA: Insufficient documentation

## 2021-07-31 ENCOUNTER — Telehealth: Payer: Self-pay | Admitting: Emergency Medicine

## 2021-07-31 DIAGNOSIS — N898 Other specified noninflammatory disorders of vagina: Secondary | ICD-10-CM

## 2021-07-31 MED ORDER — FLUCONAZOLE 150 MG PO TABS: 150.0000 mg | ORAL_TABLET | Freq: Once | ORAL | 0 refills | Status: AC

## 2021-07-31 NOTE — Telephone Encounter (Signed)
TC to patient follow request for Rx for yeast infection. Pt states that she just returned from vacation where she spent time in pools and hot tubs.  ?Reports thick, white discharge that is itchy. No concern for STD. Symptoms started 2 days ago.  ?Patient's pharmacy verified and allergies reviewed.  ?Rx for Fluconazole 150mg  sent to pharmacy. ?Pt made aware if symptoms do not improve she will need to make apt for self-swab.  ?

## 2021-09-21 ENCOUNTER — Telehealth: Payer: Self-pay

## 2021-09-21 NOTE — Telephone Encounter (Signed)
Return call to pt regarding message from Triage line on 09/18/21 pt requesting birth control pills ?Pt not ava LVM for pt to return call to the office.  ? ?

## 2021-09-22 ENCOUNTER — Telehealth: Payer: Managed Care, Other (non HMO) | Admitting: Obstetrics & Gynecology

## 2021-09-22 ENCOUNTER — Encounter: Payer: Self-pay | Admitting: Obstetrics & Gynecology

## 2021-09-22 DIAGNOSIS — Z30011 Encounter for initial prescription of contraceptive pills: Secondary | ICD-10-CM | POA: Diagnosis not present

## 2021-09-22 MED ORDER — NORETHIN ACE-ETH ESTRAD-FE 1-20 MG-MCG(24) PO TABS: 1.0000 | ORAL_TABLET | Freq: Every day | ORAL | 6 refills | Status: DC

## 2021-09-22 NOTE — Progress Notes (Signed)
? ? ?GYNECOLOGY VIRTUAL VISIT ENCOUNTER NOTE ? ?Provider location: [facility] [employer] [facility]  ? ?Patient location: [facility] [employer]) ? ?I connected with Yvette Adams on 09/22/21 at  1:00 PM EDT by MyChart Video Encounter and verified that I am speaking with the correct person using two identifiers. ?  ?I discussed the limitations, risks, security and privacy concerns of performing an evaluation and management service virtually and the availability of in person appointments. I also discussed with the patient that there may be a patient responsible charge related to this service. The patient expressed understanding and agreed to proceed. ?  ?History:  ?Yvette Adams is a 46 y.o. G56P1011 female being evaluated today for initiation of birth control pills. She desires this for period control and to help with mild perimenopausal symptoms; husband has a vasectomy.  She wants to take the hormonal active pills in a continuous fashion, to avoid having periods monthly especially since she is travelling a lot this summer.  She denies any abnormal vaginal discharge, bleeding, pelvic pain or other concerns.   ?  ?  ?Past Medical History:  ?Diagnosis Date  ? History of abnormal Pap smear   ? History of herpes simplex type 2 infection   ? Seasonal allergies   ? Smoker   ? Strep throat   ? Tinea versicolor   ? ?Past Surgical History:  ?Procedure Laterality Date  ? DILATION AND CURETTAGE OF UTERUS    ? INTRAUTERINE DEVICE (IUD) INSERTION  04/13/2012  ? Procedure: INTRAUTERINE DEVICE (IUD) INSERTION;  Surgeon: Reva Bores, MD;  Location: [facility];  Service: Gynecology;  Laterality: N/A;  ? IUD REMOVAL  04/13/2012  ? Procedure: INTRAUTERINE DEVICE (IUD) REMOVAL;  Surgeon: Reva Bores, MD;  Location: [facility];  Service: Gynecology;  Laterality: N/A;  ? WISDOM TOOTH EXTRACTION    ? x4  ? ?The following portions of the patient's history were reviewed and updated as appropriate: allergies, current  medications, past family history, past medical history, past social history, past surgical history and problem list.  ? ?Health Maintenance:  Normal pap and negative HRHPV on 04/10/2019.  Normal mammogram on 06/10/2021.  ? ?Review of Systems:  ?Pertinent items noted in HPI and remainder of comprehensive ROS otherwise negative. ? ?Physical Exam:  ? ?General:  Alert, oriented and cooperative. Patient appears to be in no acute distress.  ?Mental Status: Normal mood and affect. Normal behavior. Normal judgment and thought content.   ?Respiratory: Normal respiratory effort, no problems with respiration noted  ?Rest of physical exam deferred due to type of encounter ?   ?  ?Assessment and Plan:  ?   ?1. Oral contraception initiation ?The use of the oral contraceptive has been fully discussed with the patient. This includes the proper method to initiate (i.e. Sunday start after next normal menstrual onset versus same day start) and continue the pills, the need for regular compliance to ensure adequate effect, the physiology which make the pill effective, the instructions for what to do in event of a missed pill, and warnings about anticipated minor side effects such as breakthrough spotting, nausea, breast tenderness, weight changes, acne, headaches, etc.  She has been told of the more serious potential side effects such as heart attack, stroke, and deep vein thrombosis, all of which are very unlikely.  She has been asked to report any signs of such serious problems immediately.  She understands possibility of breakthrough bleeding in the first few months when  doing the continuous method, advised to stop hormonal pills for 4-5 days and let herself have a withdrawal bleed if bleeding occurs, then restart.  She understands and wishes to take the medication as prescribed. She was given a prescription for Loestrin 24 Fe.  She will follow up with PCP in July 2023 and will let us know of her NP then and let us know of any  concerning side effects.   ?- Norethindrone Acetate-Ethinyl Estrad-FE (LOESTRIN 24 FE) 1-20 MG-MCG(24) tablet; Take 1 tablet by mouth daily. Take hormonal pills in a continuous fashion  Dispense: 84 tablet; Refill: 6 ?    ?I discussed the assessment and treatment plan with the patient. The patient was provided an opportunity to ask questions and all were answered. The patient agreed with the plan and demonstrated an understanding of the instructions. ?  ?The patient was advised to call back or seek an in-person evaluation/go to the ED if the symptoms worsen or if the condition fails to improve as anticipated. ? ?I provided 15 minutes of face-to-face time during this encounter and 10 minutes of documentation time. ? ? ?Jaynie Collins, MD ?[facility] for [facility], [facility] ? ?

## 2022-03-19 ENCOUNTER — Other Ambulatory Visit: Payer: Self-pay | Admitting: Internal Medicine

## 2022-03-19 DIAGNOSIS — Z1231 Encounter for screening mammogram for malignant neoplasm of breast: Secondary | ICD-10-CM

## 2022-04-04 IMAGING — MG MM DIGITAL DIAGNOSTIC UNILAT*R* W/ TOMO W/ CAD
6 series · 6 of 18 positions shown · non-contrast
Comparison: Previous exam(s).

CLINICAL DATA: Painful palpable area in the RIGHT lower breast

EXAM:
DIGITAL DIAGNOSTIC UNILATERAL RIGHT MAMMOGRAM WITH TOMOSYNTHESIS AND
CAD; ULTRASOUND RIGHT BREAST LIMITED
TECHNIQUE: Right digital diagnostic mammography and breast tomosynthesis was
performed. The images were evaluated with computer-aided detection.;
Targeted ultrasound examination of the right breast was performed

[R MLO synth-2D]
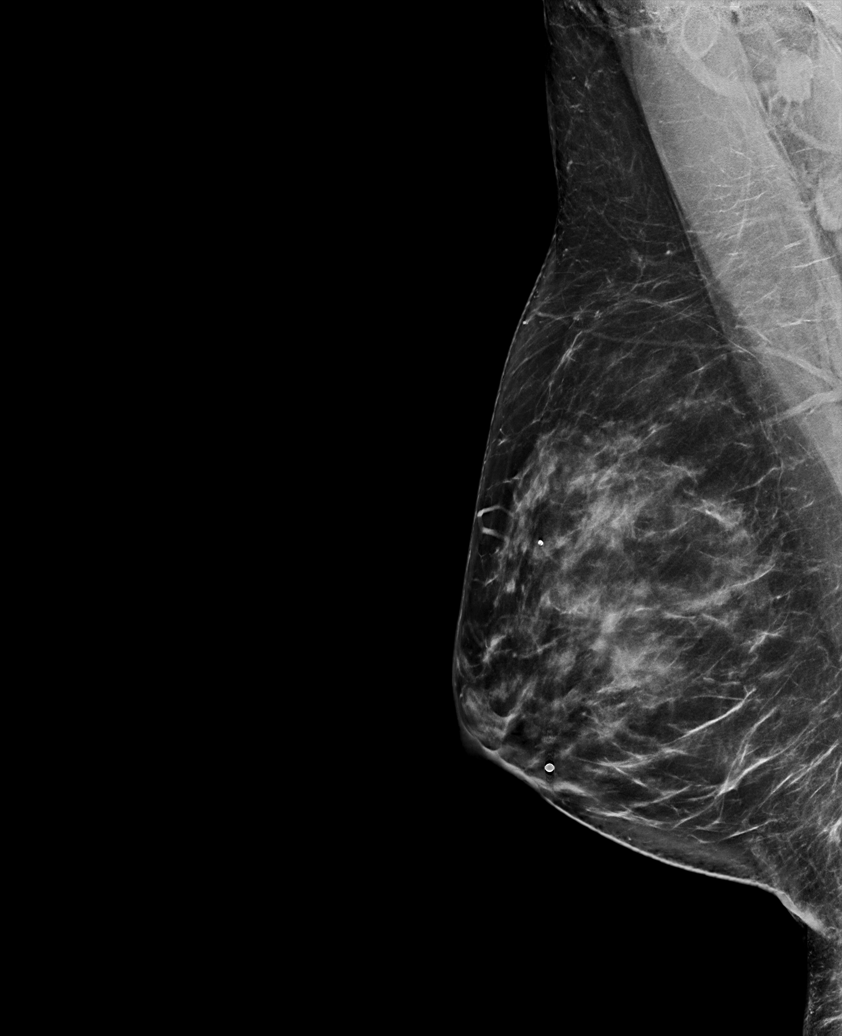

[R TAN synth-2D]
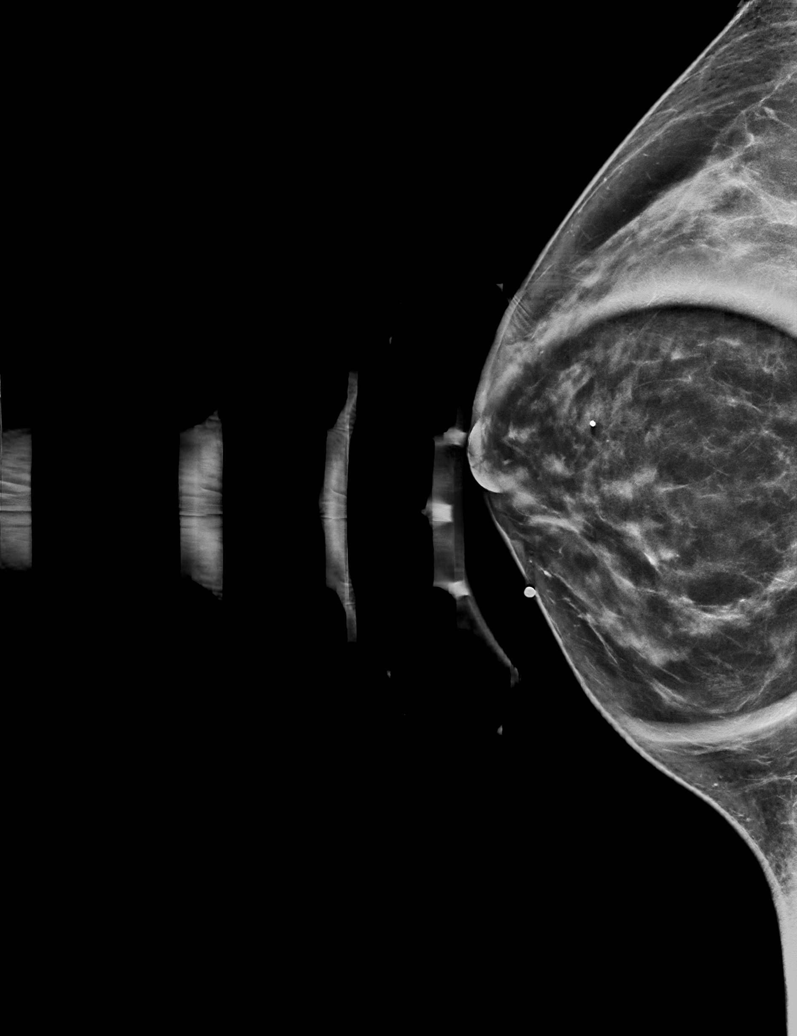

[R CC synth-2D]
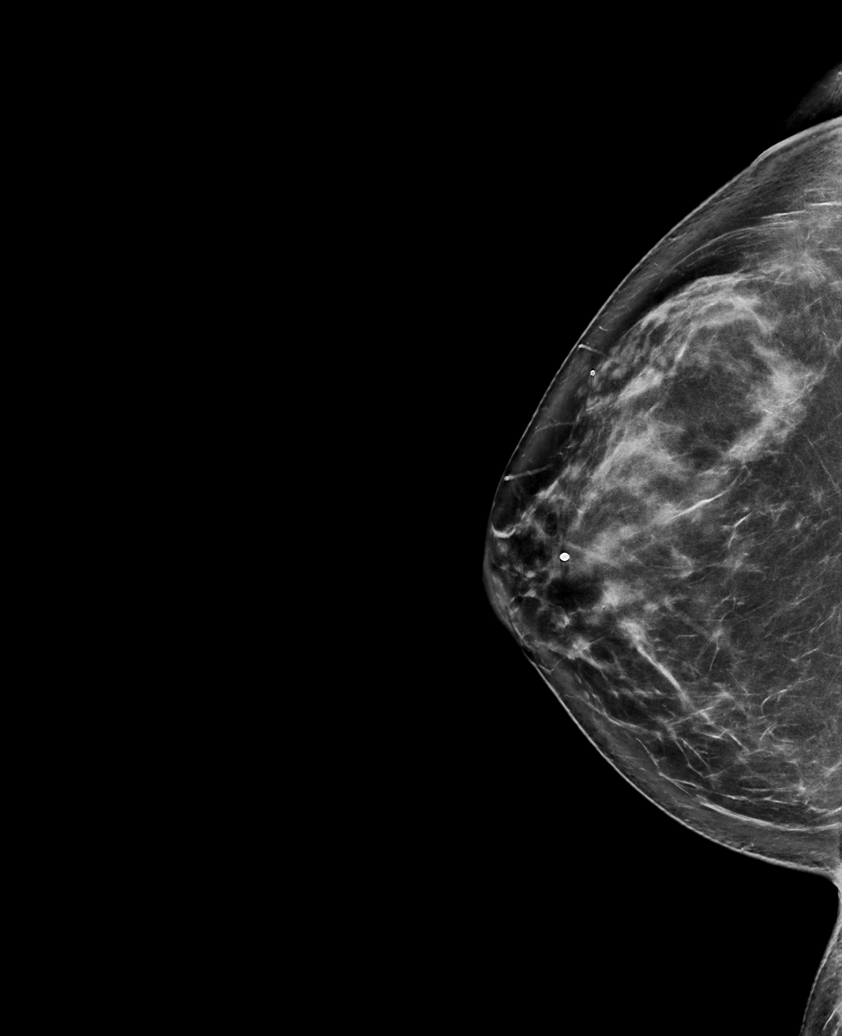

[R CC tomo · tomo slice 44/87.0]
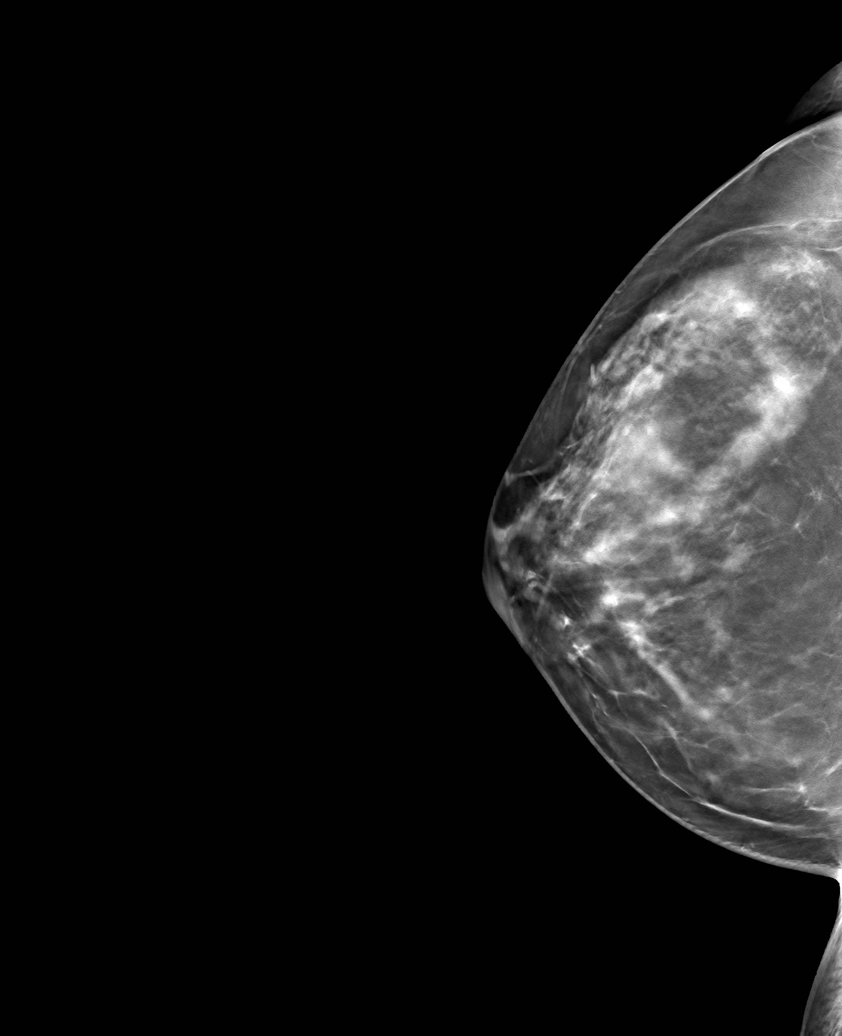

[R TAN tomo · tomo slice 39/77.0]
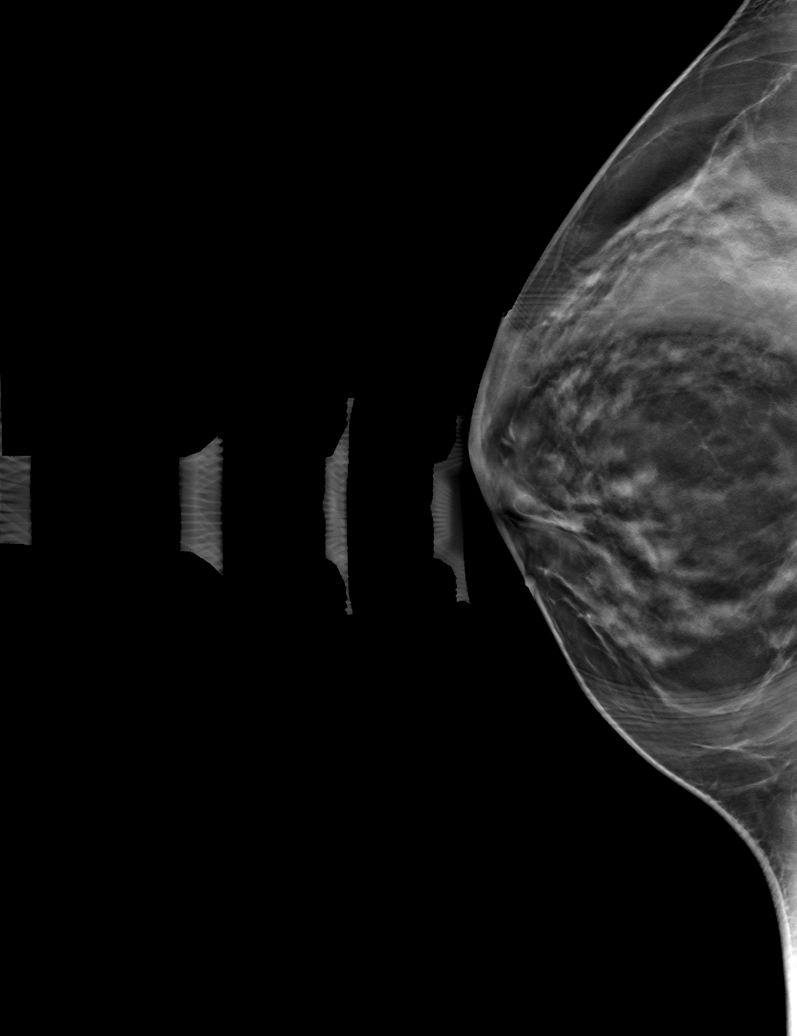

[R MLO tomo · tomo slice 43/86.0]
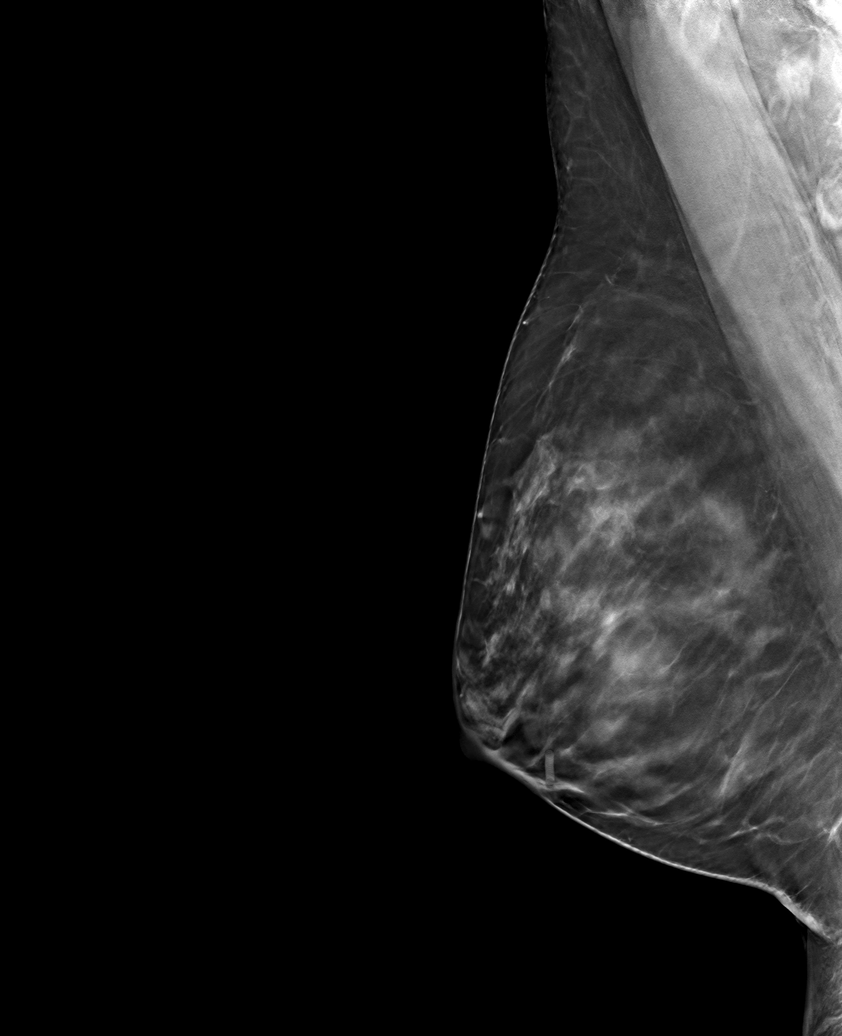

[6 of 18 positions shown; findings below may reference images not displayed]

ACR Breast Density Category c: The breast tissue is heterogeneously
dense, which may obscure small masses.
FINDINGS: Spot compression tomosynthesis views were obtained over the
palpable/painful area of concern in the RIGHT breast. No suspicious
mammographic finding is identified in this area. No suspicious mass,
microcalcification, or other finding is identified in the RIGHT
breast.

On physical exam, no suspicious mass is appreciated.

Targeted RIGHT breast ultrasound was performed in the
palpable/painful area of concern at the lower outer breast. No
suspicious solid or cystic mass is identified. Scattered
subcentimeter benign cysts were incidentally noted during real-time
examination.
IMPRESSION: 1. No mammographic or sonographic evidence of malignancy at the site
of painful/palpable concern in the RIGHT breast. Any further workup
of the patient's symptoms should be based on the clinical
assessment. Recommend routine annual screening mammogram, due
January 2022.

RECOMMENDATION:
Annual screening mammography, due January 2022

I have discussed the findings and recommendations with the patient.
If applicable, a reminder letter will be sent to the patient
regarding the next appointment.

BI-RADS CATEGORY  2: Benign.

## 2022-04-13 ENCOUNTER — Ambulatory Visit
Admission: EM | Admit: 2022-04-13 | Discharge: 2022-04-13 | Disposition: A | Discharge status: Home / Self Care | Payer: 59 | Attending: Urgent Care | Admitting: Urgent Care

## 2022-04-13 DIAGNOSIS — J45901 Unspecified asthma with (acute) exacerbation: Secondary | ICD-10-CM

## 2022-04-13 MED ORDER — AZITHROMYCIN 250 MG PO TABS: ORAL_TABLET | ORAL | 0 refills | Status: DC

## 2022-04-13 NOTE — Discharge Instructions (Signed)
Follow up here or with your primary care provider if your symptoms are worsening or not improving with treatment.     

## 2022-04-13 NOTE — ED Triage Notes (Signed)
Pt. Presents to UC w/ c/o a cough and wheezing for the past 3 days.

## 2022-04-13 NOTE — ED Provider Notes (Signed)
Yvette Adams    CSN: [id number] Arrival date & time: 04/13/22  1648      History   Chief Complaint Chief Complaint  Patient presents with   Cough   Wheezing    HPI Yvette Adams is a 46 y.o. female.    Cough Associated symptoms: wheezing   Wheezing Associated symptoms: cough     Presents to UC with c/o cough and wheezing x 3 days. She endorses hx of using albuterol inhaler following respiratory insult.  Past Medical History:  Diagnosis Date   Former Smoker    History of abnormal Pap smear    History of herpes simplex type 2 infection    Seasonal allergies    Strep throat    Tinea versicolor     Patient Active Problem List   Diagnosis Date Noted   Former smoker 12/15/2016   Allergic rhinitis due to pollen 12/12/2015    Past Surgical History:  Procedure Laterality Date   DILATION AND CURETTAGE OF UTERUS     INTRAUTERINE DEVICE (IUD) INSERTION  04/13/2012   Procedure: INTRAUTERINE DEVICE (IUD) INSERTION;  Surgeon: Reva Bores, MD;  Location: [facility];  Service: Gynecology;  Laterality: N/A;   IUD REMOVAL  04/13/2012   Procedure: INTRAUTERINE DEVICE (IUD) REMOVAL;  Surgeon: Reva Bores, MD;  Location: [facility];  Service: Gynecology;  Laterality: N/A;   WISDOM TOOTH EXTRACTION     x4    OB History     Gravida  2   Para  1   Term  1   Preterm  0   AB  1   Living  1      SAB  1   IAB  0   Ectopic  0   Multiple  0   Live Births  1            Home Medications    Prior to Admission medications   Medication Sig Start Date End Date Taking? Authorizing Provider  Norethindrone Acetate-Ethinyl Estrad-FE (LOESTRIN 24 FE) 1-20 MG-MCG(24) tablet Take 1 tablet by mouth daily. Take hormonal pills in a continuous fashion 09/22/21   Anyanwu, Jethro Bastos, MD  omeprazole (PRILOSEC) 20 MG capsule Take 20 mg by mouth daily.    [provider]    Family History Family History  Problem Relation Age of Onset   Asthma Mother     Hypertension Mother    Alcohol abuse Father    Diabetes Maternal Grandmother    Pancreatic cancer Maternal Grandmother        pancreatic   Colon cancer Maternal Uncle    Colon cancer Cousin        Maternal side   Breast cancer Neg Hx     Social History Social History   Tobacco Use   Smoking status: Former    Packs/day: 0.00    Years: 20.00    Total pack years: 0.00    Types: Cigarettes    Quit date: 05/10/2013    Years since quitting: 8.9   Smokeless tobacco: Never  Vaping Use   Vaping Use: Never used  Substance Use Topics   Alcohol use: Yes    Comment: weekends   Drug use: No     Allergies   Erythromycin   Review of Systems Review of Systems  Respiratory:  Positive for cough and wheezing.      Physical Exam Triage Vital Signs ED Triage Vitals  Enc Vitals Group     BP 04/13/22  1737 118/84     Pulse Rate 04/13/22 1737 84     Resp 04/13/22 1737 16     Temp 04/13/22 1737 98.7 F (37.1 C)     Temp Source 04/13/22 1737 Oral     SpO2 04/13/22 1737 97 %     Weight --      Height --      Head Circumference --      Peak Flow --      Pain Score 04/13/22 1732 0     Pain Loc --      Pain Edu? --      Excl. in GC? --    No data found.  Updated Vital Signs BP 118/84 (BP Location: Left Arm)   Pulse 84   Temp 98.7 F (37.1 C) (Oral)   Resp 16   LMP 03/25/2022 (Approximate)   SpO2 97%   Visual Acuity Right Eye Distance:   Left Eye Distance:   Bilateral Distance:    Right Eye Near:   Left Eye Near:    Bilateral Near:     Physical Exam Vitals reviewed.  Constitutional:      Appearance: Normal appearance.  Pulmonary:     Breath sounds: Wheezing and rhonchi present.  Skin:    General: Skin is warm and dry.  Neurological:     General: No focal deficit present.     Mental Status: She is alert and oriented to person, place, and time.  Psychiatric:        Mood and Affect: Mood normal.        Behavior: Behavior normal.      UC Treatments /  Results  Labs (all labs ordered are listed, but only abnormal results are displayed) Labs Reviewed - No data to display  EKG   Radiology No results found.  Procedures Procedures (including critical care time)  Medications Ordered in UC Medications - No data to display  Initial Impression / Assessment and Plan / UC Course  I have reviewed the triage vital signs and the nursing notes.  Pertinent labs & imaging results that were available during my care of the patient were reviewed by me and considered in my medical decision making (see chart for details).   Acute on chronic reactive airway.  Patient states she would not use prednisone because of side effects.  Will prescribe azithromycin though I expressed doubt that it would be effective.  Urged her to return if symptoms continue.   Final Clinical Impressions(s) / UC Diagnoses   Final diagnoses:  None   Discharge Instructions   None    ED Prescriptions   None    PDMP not reviewed this encounter.   Charma Igo, [place] 04/13/22 1755

## 2022-05-05 ENCOUNTER — Ambulatory Visit
Admission: RE | Admit: 2022-05-05 | Discharge: 2022-05-05 | Disposition: A | Discharge status: Home / Self Care | Payer: 59 | Source: Ambulatory Visit | Attending: Internal Medicine | Admitting: Internal Medicine

## 2022-05-05 DIAGNOSIS — Z1231 Encounter for screening mammogram for malignant neoplasm of breast: Secondary | ICD-10-CM | POA: Insufficient documentation

## 2022-07-13 ENCOUNTER — Other Ambulatory Visit (HOSPITAL_COMMUNITY)
Admission: RE | Admit: 2022-07-13 | Discharge: 2022-07-13 | Disposition: A | Discharge status: Home / Self Care | Payer: BC Managed Care – PPO | Source: Ambulatory Visit | Attending: Obstetrics & Gynecology | Admitting: Obstetrics & Gynecology

## 2022-07-13 ENCOUNTER — Encounter: Payer: Self-pay | Admitting: Obstetrics & Gynecology

## 2022-07-13 ENCOUNTER — Ambulatory Visit (INDEPENDENT_AMBULATORY_CARE_PROVIDER_SITE_OTHER): Payer: BC Managed Care – PPO | Admitting: Obstetrics & Gynecology

## 2022-07-13 VITALS — BP 111/76 | HR 85 | Ht 63.0 in | Wt 160.0 lb

## 2022-07-13 DIAGNOSIS — N898 Other specified noninflammatory disorders of vagina: Secondary | ICD-10-CM

## 2022-07-13 DIAGNOSIS — Z01419 Encounter for gynecological examination (general) (routine) without abnormal findings: Secondary | ICD-10-CM

## 2022-07-13 DIAGNOSIS — R399 Unspecified symptoms and signs involving the genitourinary system: Secondary | ICD-10-CM | POA: Diagnosis not present

## 2022-07-13 DIAGNOSIS — B3731 Acute candidiasis of vulva and vagina: Secondary | ICD-10-CM | POA: Diagnosis not present

## 2022-07-13 NOTE — Progress Notes (Signed)
GYNECOLOGY ANNUAL PREVENTATIVE CARE ENCOUNTER NOTE  History:     Yvette Adams is a 47 y.o. G54P1011 female here for a routine annual gynecologic exam.  Current complaints: burning after urination for a few days, worried about possible UTI or vaginitis.   Denies abnormal vaginal bleeding, discharge, pelvic pain, problems with intercourse or other gynecologic concerns.    Gynecologic History No LMP recorded. Contraception: vasectomy Last Pap: 04/10/2019. Result was normal with negative HPV Last Mammogram: 05/05/22.  Result was normal Has referral from PCP to do colonoscopy  Obstetric History OB History  Gravida Para Term Preterm AB Living  '2 1 1 '$ 0 1 1  SAB IAB Ectopic Multiple Live Births  1 0 0 0 1    # Outcome Date GA Lbr Len/2nd Weight Sex Delivery Anes PTL Lv  2 Term 2005    F Vag-Spont   LIV  1 SAB             Past Medical History:  Diagnosis Date   Former Smoker    History of abnormal Pap smear    History of herpes simplex type 2 infection    Seasonal allergies    Strep throat    Tinea versicolor     Past Surgical History:  Procedure Laterality Date   DILATION AND CURETTAGE OF UTERUS     INTRAUTERINE DEVICE (IUD) INSERTION  04/13/2012   Procedure: INTRAUTERINE DEVICE (IUD) INSERTION;  Surgeon: Donnamae Jude, MD;  Location: [place] [facility];  Service: Gynecology;  Laterality: N/A;   IUD REMOVAL  04/13/2012   Procedure: INTRAUTERINE DEVICE (IUD) REMOVAL;  Surgeon: Donnamae Jude, MD;  Location: [place] [facility];  Service: Gynecology;  Laterality: N/A;   WISDOM TOOTH EXTRACTION     x4    Current Outpatient Medications on File Prior to Visit  Medication Sig Dispense Refill   omeprazole (PRILOSEC) 20 MG capsule Take 20 mg by mouth daily.     No current facility-administered medications on file prior to visit.    Allergies  Allergen Reactions   Erythromycin     ABDOMINAL CRAMPING OK with ZPAK    Social History:  reports that she quit smoking about 9 years ago. Her smoking  use included cigarettes. She has never used smokeless tobacco. She reports current alcohol use. She reports that she does not use drugs.  Family History  Problem Relation Age of Onset   Asthma Mother    Hypertension Mother    Alcohol abuse Father    Diabetes Maternal Grandmother    Pancreatic cancer Maternal Grandmother        pancreatic   Colon cancer Maternal Uncle    Colon cancer Cousin        Maternal side   Breast cancer Neg Hx     The following portions of the patient's history were reviewed and updated as appropriate: allergies, current medications, past family history, past medical history, past social history, past surgical history and problem list.  Review of Systems Pertinent items noted in HPI and remainder of comprehensive ROS otherwise negative.  Physical Exam:  BP 111/76   Pulse 85   Ht '5\' 3"'$  (1.6 m)   Wt 160 lb (72.6 kg)   BMI 28.34 kg/m  CONSTITUTIONAL: Well-developed, well-nourished female in no acute distress.  HENT:  Normocephalic, atraumatic, External right and left ear normal.  EYES: Conjunctivae and EOM are normal. Pupils are equal, round, and reactive to light. No scleral icterus.  NECK: Normal range of motion, supple,  no masses.  Normal thyroid.  SKIN: Skin is warm and dry. No rash noted. Not diaphoretic. No erythema. No pallor. MUSCULOSKELETAL: Normal range of motion. No tenderness.  No cyanosis, clubbing, or edema. NEUROLOGIC: Alert and oriented to person, place, and time. Normal reflexes, muscle tone coordination.  PSYCHIATRIC: Normal mood and affect. Normal behavior. Normal judgment and thought content. CARDIOVASCULAR: Normal heart rate noted, regular rhythm RESPIRATORY: Clear to auscultation bilaterally. Effort and breath sounds normal, no problems with respiration noted. BREASTS: Symmetric in size. No masses, tenderness, skin changes, nipple drainage, or lymphadenopathy bilaterally. Performed in the presence of a chaperone. ABDOMEN: Soft, no  distention noted.  No tenderness, rebound or guarding.  PELVIC: Normal appearing external genitalia and urethral meatus; normal appearing vaginal mucosa and cervix.  No abnormal vaginal discharge noted.  Pap smear obtained.  Normal uterine size, no other palpable masses, no uterine or adnexal tenderness.  Performed in the presence of a chaperone.   Assessment and Plan:    1. UTI symptoms Equivocal urine dipstick at outside office, culture sent. Will follow up results and manage accordingly. - Urine Culture  2. Vaginal discharge - Cervicovaginal ancillary only done, will follow up results and manage accordingly.  3. Well woman exam with routine gynecological exam - Cytology - PAP Will follow up results of pap smear and manage accordingly. Mammogram is up to date. Discussed perimenopause briefly, told what signs to look out for and to let us know of any concerns. Also briefly discussed therapies for different concerns. Routine preventative health maintenance measures emphasized. Please refer to After Visit Summary for other counseling recommendations.      Verita Schneiders, MD, [place] for [employer], [facility]

## 2022-07-15 LAB — CERVICOVAGINAL ANCILLARY ONLY
Bacterial Vaginitis (gardnerella): NEGATIVE
Candida Glabrata: NEGATIVE
Candida Vaginitis: POSITIVE — AB
Comment: NEGATIVE
Comment: NEGATIVE
Comment: NEGATIVE

## 2022-07-15 LAB — URINE CULTURE: Organism ID, Bacteria: NO GROWTH

## 2022-07-15 MED ORDER — FLUCONAZOLE 150 MG PO TABS: 150.0000 mg | ORAL_TABLET | Freq: Once | ORAL | 3 refills | Status: AC

## 2022-07-15 NOTE — Addendum Note (Signed)
Addended by: Verita Schneiders A on: 07/15/2022 03:22 PM   Modules accepted: Orders

## 2022-07-19 LAB — CYTOLOGY - PAP
Comment: NEGATIVE
Diagnosis: NEGATIVE
High risk HPV: NEGATIVE

## 2022-08-03 ENCOUNTER — Encounter: Payer: Self-pay | Admitting: Obstetrics & Gynecology

## 2022-08-03 DIAGNOSIS — F419 Anxiety disorder, unspecified: Secondary | ICD-10-CM

## 2022-08-03 DIAGNOSIS — N951 Menopausal and female climacteric states: Secondary | ICD-10-CM

## 2022-08-04 MED ORDER — ESCITALOPRAM OXALATE 10 MG PO TABS: 10.0000 mg | ORAL_TABLET | Freq: Every day | ORAL | 10 refills | Status: DC

## 2022-08-31 ENCOUNTER — Other Ambulatory Visit: Payer: Self-pay | Admitting: Obstetrics & Gynecology

## 2022-08-31 DIAGNOSIS — F419 Anxiety disorder, unspecified: Secondary | ICD-10-CM

## 2022-08-31 DIAGNOSIS — N951 Menopausal and female climacteric states: Secondary | ICD-10-CM

## 2023-01-28 ENCOUNTER — Ambulatory Visit: Payer: 59

## 2023-01-28 DIAGNOSIS — K208 Other esophagitis without bleeding: Secondary | ICD-10-CM | POA: Diagnosis not present

## 2023-01-28 DIAGNOSIS — Z83719 Family history of colon polyps, unspecified: Secondary | ICD-10-CM | POA: Diagnosis not present

## 2023-01-28 DIAGNOSIS — K2970 Gastritis, unspecified, without bleeding: Secondary | ICD-10-CM | POA: Diagnosis not present

## 2023-01-28 DIAGNOSIS — K449 Diaphragmatic hernia without obstruction or gangrene: Secondary | ICD-10-CM | POA: Diagnosis not present

## 2023-01-28 DIAGNOSIS — K640 First degree hemorrhoids: Secondary | ICD-10-CM | POA: Diagnosis not present

## 2023-01-28 DIAGNOSIS — Z1211 Encounter for screening for malignant neoplasm of colon: Secondary | ICD-10-CM | POA: Diagnosis present

## 2023-04-11 ENCOUNTER — Other Ambulatory Visit: Payer: Self-pay | Admitting: Internal Medicine

## 2023-04-11 DIAGNOSIS — Z1231 Encounter for screening mammogram for malignant neoplasm of breast: Secondary | ICD-10-CM

## 2023-05-09 ENCOUNTER — Ambulatory Visit
Admission: RE | Admit: 2023-05-09 | Discharge: 2023-05-09 | Disposition: A | Discharge status: Home / Self Care | Payer: BC Managed Care – PPO | Source: Ambulatory Visit | Attending: Internal Medicine | Admitting: Internal Medicine

## 2023-05-09 DIAGNOSIS — Z1231 Encounter for screening mammogram for malignant neoplasm of breast: Secondary | ICD-10-CM | POA: Insufficient documentation

## 2024-02-27 ENCOUNTER — Ambulatory Visit

## 2024-04-18 ENCOUNTER — Other Ambulatory Visit: Payer: Self-pay | Admitting: Internal Medicine

## 2024-04-18 DIAGNOSIS — Z1231 Encounter for screening mammogram for malignant neoplasm of breast: Secondary | ICD-10-CM

## 2024-04-24 ENCOUNTER — Other Ambulatory Visit (HOSPITAL_COMMUNITY)
Admission: RE | Admit: 2024-04-24 | Discharge: 2024-04-24 | Disposition: A | Source: Ambulatory Visit | Attending: Obstetrics & Gynecology | Admitting: Obstetrics & Gynecology

## 2024-04-24 ENCOUNTER — Ambulatory Visit: Admitting: *Deleted

## 2024-04-24 DIAGNOSIS — Z113 Encounter for screening for infections with a predominantly sexual mode of transmission: Secondary | ICD-10-CM | POA: Diagnosis present

## 2024-04-24 NOTE — Progress Notes (Unsigned)
 SUBJECTIVE:  48 y.o. female who desires a STI screen. Denies abnormal vaginal discharge, bleeding or significant pelvic pain. No UTI symptoms. Denies history of known exposure to STD.  No LMP recorded.  OBJECTIVE:  She appears well.   ASSESSMENT:  STI Screen   PLAN:  Pt offered STI blood screening-will order at appointment on 05/15/24 per patient.  GC, chlamydia, and trichomonas probe sent to lab.  Treatment: To be determined once lab results are received.  Pt follow up as needed.

## 2024-04-26 LAB — CERVICOVAGINAL ANCILLARY ONLY
Chlamydia: NEGATIVE
Comment: NEGATIVE
Comment: NEGATIVE
Comment: NORMAL
Neisseria Gonorrhea: NEGATIVE
Trichomonas: NEGATIVE

## 2024-04-27 ENCOUNTER — Ambulatory Visit: Payer: Self-pay | Admitting: Obstetrics & Gynecology

## 2024-05-09 ENCOUNTER — Encounter

## 2024-05-15 ENCOUNTER — Ambulatory Visit: Admitting: Obstetrics & Gynecology

## 2024-05-15 ENCOUNTER — Encounter: Payer: Self-pay | Admitting: Obstetrics & Gynecology

## 2024-05-15 VITALS — BP 116/77 | HR 84 | Ht 63.0 in | Wt 143.0 lb

## 2024-05-15 DIAGNOSIS — Z01419 Encounter for gynecological examination (general) (routine) without abnormal findings: Secondary | ICD-10-CM

## 2024-05-15 DIAGNOSIS — Z113 Encounter for screening for infections with a predominantly sexual mode of transmission: Secondary | ICD-10-CM

## 2024-05-15 NOTE — Progress Notes (Signed)
 .   GYNECOLOGY ANNUAL PREVENTATIVE CARE ENCOUNTER NOTE  History:    Yvette Adams is a 49 y.o. G70P1011 female here for a routine annual gynecologic exam.  Current complaints: none.   Denies abnormal vaginal bleeding, discharge, pelvic pain, problems with intercourse or other gynecologic concerns.  Gynecologic History Patient's last menstrual period was 05/06/2024. Contraception: none Last Pap: 07/13/2022. Result was normal with negative HPV Last Mammogram: 05/09/2023.  Result was normal, scheduled for next one 06/01/2024. Last Colonoscopy: 02/08/2023.  Result was benign.  Obstetric History OB History  Gravida Para Term Preterm AB Living  2 1 1  0 1 1  SAB IAB Ectopic Multiple Live Births  1 0 0 0 1    # Outcome Date GA Lbr Len/2nd Weight Sex Type Anes PTL Lv  2 Term 2005    F Vag-Spont   LIV  1 SAB             Past Medical History:  Diagnosis Date   Former Smoker    History of abnormal Pap smear    History of herpes simplex type 2 infection    Seasonal allergies    Strep throat    Tinea versicolor     Past Surgical History:  Procedure Laterality Date   DILATION AND CURETTAGE OF UTERUS     INTRAUTERINE DEVICE (IUD) INSERTION  04/13/2012   Procedure: INTRAUTERINE DEVICE (IUD) INSERTION;  Surgeon: Glenys GORMAN Birk, MD;  Location: [facility];  Service: Gynecology;  Laterality: N/A;   IUD REMOVAL  04/13/2012   Procedure: INTRAUTERINE DEVICE (IUD) REMOVAL;  Surgeon: Glenys GORMAN Birk, MD;  Location: [facility];  Service: Gynecology;  Laterality: N/A;   WISDOM TOOTH EXTRACTION     x4    Medications Ordered Prior to Encounter[1]  Allergies[2]  Social History:  reports that she quit smoking about 31 years ago. Her smoking use included cigarettes. She has never used smokeless tobacco. She reports current alcohol use. She reports that she does not use drugs.  She is currently undergoing a divorce.  Family History  Problem Relation Age of Onset   Asthma Mother    Hypertension Mother     Alcohol abuse Father    Diabetes Maternal Grandmother    Pancreatic cancer Maternal Grandmother        pancreatic   Colon cancer Maternal Uncle    Colon cancer Cousin        Maternal side   Breast cancer Neg Hx     The following portions of the patient's history were reviewed and updated as appropriate: allergies, current medications, past family history, past medical history, past social history, past surgical history and problem list.  Review of Systems Pertinent items noted in HPI and remainder of comprehensive ROS otherwise negative.  Physical Exam:  BP 116/77   Pulse 84   Ht 5' 3 (1.6 m)   Wt 143 lb (64.9 kg)   LMP 05/06/2024   BMI 25.33 kg/m  CONSTITUTIONAL: Well-developed, well-nourished female in no acute distress.  HENT:  Normocephalic, atraumatic, External right and left ear normal.  EYES: Conjunctivae and EOM are normal. Pupils are equal, round, and reactive to light. No scleral icterus.  NECK: Normal range of motion, supple, no masses observed. SKIN: Skin is warm and dry. No rash noted. Not diaphoretic. No erythema. No pallor. MUSCULOSKELETAL: Normal range of motion. No tenderness.  No cyanosis, clubbing, or edema. NEUROLOGIC: Alert and oriented to person, place, and time. Normal muscle tone coordination.  PSYCHIATRIC: Normal mood  and affect. Normal behavior. Normal judgment and thought content. CARDIOVASCULAR: Normal heart rate noted, regular rhythm RESPIRATORY: Clear to auscultation bilaterally. Effort and breath sounds normal, no problems with respiration noted. BREASTS: Deferred. ABDOMEN: Soft, no distention noted.  No tenderness, rebound or guarding.  PELVIC: Deferred.  Assessment and Plan:     1. Routine screening for STI (sexually transmitted infection) Negative GC/Chlam/Trich screening on 04/24/2024. Wants serum testing. - RPR+HBsAg+HCVAb+...  2. Well woman exam with routine gynecological exam (Primary) Up to date with pap, mammogram and colon cancer  screening. Desires IUD, had this twice in the past, will return for this. Premedication with Ibuprofen  and Tylenol recommended.  Routine preventative health maintenance measures emphasized. Please refer to After Visit Summary for other counseling recommendations.      [name], MD, FACOG Obstetrician & Gynecologist, [facility] for [facility], [facility] '     [1]  Current Outpatient Medications on File Prior to Visit  Medication Sig Dispense Refill   buPROPion (WELLBUTRIN XL) 300 MG 24 hr tablet Take 300 mg by mouth daily.     omeprazole (PRILOSEC) 20 MG capsule Take 20 mg by mouth daily. (Patient not taking: Reported on 05/15/2024)     No current facility-administered medications on file prior to visit.  [2]  Allergies Allergen Reactions   Erythromycin     ABDOMINAL CRAMPING OK with ZPAK

## 2024-05-15 NOTE — Patient Instructions (Signed)
 Please premedicate with Ibuprofen  800 mg and Tylenol 1000 mg 1 hour prior to appointment

## 2024-05-16 ENCOUNTER — Ambulatory Visit: Payer: Self-pay | Admitting: Obstetrics & Gynecology

## 2024-05-16 LAB — RPR+HBSAG+HCVAB+...
HIV Screen 4th Generation wRfx: NONREACTIVE
Hep C Virus Ab: NONREACTIVE
Hepatitis B Surface Ag: NEGATIVE
RPR Ser Ql: NONREACTIVE

## 2024-05-22 ENCOUNTER — Ambulatory Visit: Admitting: Obstetrics & Gynecology

## 2024-05-22 VITALS — BP 110/74 | HR 82 | Wt 143.0 lb

## 2024-05-22 DIAGNOSIS — Z3202 Encounter for pregnancy test, result negative: Secondary | ICD-10-CM | POA: Diagnosis not present

## 2024-05-22 DIAGNOSIS — Z30430 Encounter for insertion of intrauterine contraceptive device: Secondary | ICD-10-CM

## 2024-05-22 LAB — POCT URINE PREGNANCY: Preg Test, Ur: NEGATIVE

## 2024-05-22 MED ORDER — LEVONORGESTREL 20 MCG/DAY IU IUD
1.0000 | INTRAUTERINE_SYSTEM | Freq: Once | INTRAUTERINE | Status: AC
  Administered 2024-05-22: 1 via INTRAUTERINE

## 2024-05-22 NOTE — Patient Instructions (Signed)

## 2024-05-22 NOTE — Addendum Note (Signed)
 Addended by: ELAINE ROSINA SAILOR on: 05/22/2024 03:53 PM   Modules accepted: Orders

## 2024-05-22 NOTE — Progress Notes (Signed)
" ° ° °  GYNECOLOGY OFFICE PROCEDURE NOTE  Yvette Adams is a 49 y.o. G2P1011 here for Mirena  IUD insertion. No GYN concerns.  Last pap smear was on 07/13/2022 and was normal with negative HRHPV.  IUD Insertion Procedure Note Patient identified, informed consent performed, consent signed.   Discussed risks of irregular bleeding, cramping, infection, malpositioning or misplacement of the IUD outside the uterus which may require further procedure such as laparoscopy. Also discussed >99% contraception efficacy, increased risk of ectopic pregnancy with failure of method.   Emphasized that this did not protect against STIs, condoms recommended during all sexual encounters. Time out was performed.  Chaperone Yvette Sink, Yvette Adams) present.  Urine pregnancy test negative.  Speculum placed in the vagina.  Cervix visualized.  Cleaned with Betadine x 3.  Grasped anteriorly with a single tooth tenaculum.  Uterus sounded to 7 cm.  Mirena  IUD placed per manufacturer's recommendations.  Strings trimmed to 32cm. Tenaculum was removed, good hemostasis noted.  Patient tolerated procedure well.   Patient was given post-procedure instructions.  Patient was also asked to check IUD strings periodically and follow up in 4 weeks for IUD check.   [name], MD, FACOG Obstetrician & Gynecologist, [facility] for [employer], [facility]  "

## 2024-06-01 ENCOUNTER — Ambulatory Visit
Admission: RE | Admit: 2024-06-01 | Discharge: 2024-06-01 | Disposition: A | Source: Ambulatory Visit | Attending: Internal Medicine | Admitting: Internal Medicine

## 2024-06-01 DIAGNOSIS — Z1231 Encounter for screening mammogram for malignant neoplasm of breast: Secondary | ICD-10-CM | POA: Diagnosis present

## 2024-06-26 ENCOUNTER — Ambulatory Visit: Admitting: Obstetrics & Gynecology

## 2024-07-31 ENCOUNTER — Ambulatory Visit: Admitting: Obstetrics & Gynecology
